# Patient Record
Sex: Female | Born: 1955
Health system: Southern US, Community
[De-identification: ages and names within clinical notes are randomized; demographics above are authoritative.]

## PROBLEM LIST (undated history)

## (undated) DIAGNOSIS — D219 Benign neoplasm of connective and other soft tissue, unspecified: Secondary | ICD-10-CM

## (undated) DIAGNOSIS — L309 Dermatitis, unspecified: Secondary | ICD-10-CM

## (undated) DIAGNOSIS — E785 Hyperlipidemia, unspecified: Secondary | ICD-10-CM

## (undated) DIAGNOSIS — E119 Type 2 diabetes mellitus without complications: Secondary | ICD-10-CM

## (undated) DIAGNOSIS — R32 Unspecified urinary incontinence: Secondary | ICD-10-CM

## (undated) DIAGNOSIS — T7840XA Allergy, unspecified, initial encounter: Secondary | ICD-10-CM

## (undated) DIAGNOSIS — I10 Essential (primary) hypertension: Secondary | ICD-10-CM

## (undated) DIAGNOSIS — F419 Anxiety disorder, unspecified: Secondary | ICD-10-CM

## (undated) DIAGNOSIS — N8500 Endometrial hyperplasia, unspecified: Secondary | ICD-10-CM

## (undated) DIAGNOSIS — E282 Polycystic ovarian syndrome: Secondary | ICD-10-CM

## (undated) DIAGNOSIS — K5790 Diverticulosis of intestine, part unspecified, without perforation or abscess without bleeding: Secondary | ICD-10-CM

## (undated) DIAGNOSIS — L719 Rosacea, unspecified: Secondary | ICD-10-CM

## (undated) DIAGNOSIS — N3010 Interstitial cystitis (chronic) without hematuria: Secondary | ICD-10-CM

## (undated) DIAGNOSIS — H269 Unspecified cataract: Secondary | ICD-10-CM

## (undated) HISTORY — DX: Rosacea, unspecified: L71.9

## (undated) HISTORY — PX: TONSILLECTOMY AND ADENOIDECTOMY: SUR1326

## (undated) HISTORY — DX: Type 2 diabetes mellitus without complications: E11.9

## (undated) HISTORY — PX: OTHER SURGICAL HISTORY: SHX169

## (undated) HISTORY — DX: Unspecified urinary incontinence: R32

## (undated) HISTORY — DX: Endometrial hyperplasia, unspecified: N85.00

## (undated) HISTORY — DX: Diverticulosis of intestine, part unspecified, without perforation or abscess without bleeding: K57.90

## (undated) HISTORY — DX: Essential (primary) hypertension: I10

## (undated) HISTORY — DX: Anxiety disorder, unspecified: F41.9

## (undated) HISTORY — DX: Hyperlipidemia, unspecified: E78.5

## (undated) HISTORY — DX: Unspecified cataract: H26.9

## (undated) HISTORY — PX: COLONOSCOPY: SHX174

## (undated) HISTORY — DX: Benign neoplasm of connective and other soft tissue, unspecified: D21.9

## (undated) HISTORY — PX: DILATION AND CURETTAGE OF UTERUS: SHX78

## (undated) HISTORY — DX: Interstitial cystitis (chronic) without hematuria: N30.10

## (undated) HISTORY — PX: EYE SURGERY: SHX253

## (undated) HISTORY — DX: Dermatitis, unspecified: L30.9

## (undated) HISTORY — PX: WISDOM TOOTH EXTRACTION: SHX21

## (undated) HISTORY — DX: Allergy, unspecified, initial encounter: T78.40XA

## (undated) HISTORY — DX: Polycystic ovarian syndrome: E28.2

---

## 1998-12-26 ENCOUNTER — Other Ambulatory Visit: Admission: RE | Admit: 1998-12-26 | Discharge: 1998-12-26 | Payer: Self-pay | Admitting: Obstetrics and Gynecology

## 1999-10-19 ENCOUNTER — Other Ambulatory Visit: Admission: RE | Admit: 1999-10-19 | Discharge: 1999-10-19 | Payer: Self-pay | Admitting: Obstetrics and Gynecology

## 2000-10-20 ENCOUNTER — Other Ambulatory Visit: Admission: RE | Admit: 2000-10-20 | Discharge: 2000-10-20 | Payer: Self-pay | Admitting: Obstetrics and Gynecology

## 2001-11-11 ENCOUNTER — Other Ambulatory Visit: Admission: RE | Admit: 2001-11-11 | Discharge: 2001-11-11 | Payer: Self-pay | Admitting: Obstetrics and Gynecology

## 2002-11-30 ENCOUNTER — Other Ambulatory Visit: Admission: RE | Admit: 2002-11-30 | Discharge: 2002-11-30 | Payer: Self-pay | Admitting: Obstetrics and Gynecology

## 2003-12-09 ENCOUNTER — Other Ambulatory Visit: Admission: RE | Admit: 2003-12-09 | Discharge: 2003-12-09 | Payer: Self-pay | Admitting: Obstetrics and Gynecology

## 2004-12-11 ENCOUNTER — Other Ambulatory Visit: Admission: RE | Admit: 2004-12-11 | Discharge: 2004-12-11 | Payer: Self-pay | Admitting: Obstetrics and Gynecology

## 2005-03-21 HISTORY — PX: ENDOMETRIAL BIOPSY: SHX622

## 2005-09-12 ENCOUNTER — Ambulatory Visit: Payer: Self-pay | Admitting: Internal Medicine

## 2005-09-26 ENCOUNTER — Ambulatory Visit: Payer: Self-pay | Admitting: Internal Medicine

## 2005-09-30 ENCOUNTER — Encounter: Admission: RE | Admit: 2005-09-30 | Discharge: 2005-12-29 | Payer: Self-pay | Admitting: Internal Medicine

## 2006-01-10 ENCOUNTER — Other Ambulatory Visit: Admission: RE | Admit: 2006-01-10 | Discharge: 2006-01-10 | Payer: Self-pay | Admitting: Obstetrics and Gynecology

## 2007-01-20 ENCOUNTER — Other Ambulatory Visit: Admission: RE | Admit: 2007-01-20 | Discharge: 2007-01-20 | Payer: Self-pay | Admitting: Obstetrics and Gynecology

## 2007-12-25 IMAGING — CT CT CHEST W/O CM
1 series · 1 of 1 positions shown, 2 images · IV contrast (CONTRAST)
Comparison: none

[Series 3: tracker · 1 of 1 slices shown, 2 images]
[im 1/1  mediastinal]
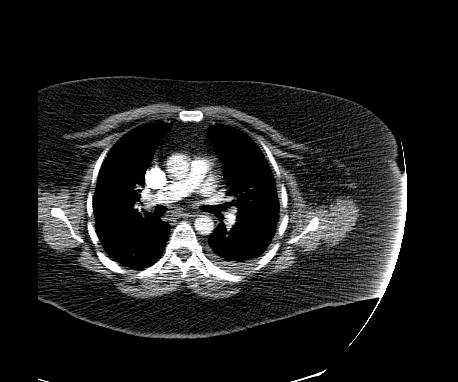
[im 1/1  lung]
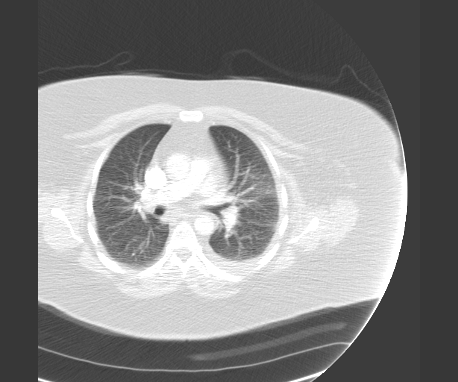

[1 of 1 positions shown; findings below may reference images not displayed]

Canned report from images found in remote index.

Refer to host system for actual result text.

## 2008-01-01 ENCOUNTER — Other Ambulatory Visit: Admission: RE | Admit: 2008-01-01 | Discharge: 2008-01-01 | Payer: Self-pay | Admitting: Obstetrics and Gynecology

## 2009-03-21 HISTORY — PX: ENDOMETRIAL BIOPSY: SHX622

## 2011-01-22 HISTORY — PX: EYE SURGERY: SHX253

## 2012-08-08 ENCOUNTER — Ambulatory Visit (INDEPENDENT_AMBULATORY_CARE_PROVIDER_SITE_OTHER): Payer: BC Managed Care – PPO | Admitting: Physician Assistant

## 2012-08-08 VITALS — BP 110/70 | HR 86 | Temp 98.0°F | Resp 16 | Ht 61.5 in | Wt 162.0 lb

## 2012-08-08 DIAGNOSIS — T161XXA Foreign body in right ear, initial encounter: Secondary | ICD-10-CM

## 2012-08-08 DIAGNOSIS — T169XXA Foreign body in ear, unspecified ear, initial encounter: Secondary | ICD-10-CM

## 2012-08-08 NOTE — Progress Notes (Signed)
I was directly involved with the patient's care and agree with the physical, diagnosis and treatment plan.  

## 2012-08-08 NOTE — Progress Notes (Signed)
  Subjective:    Patient ID: Christina Warren, female    DOB: 03-Aug-1955, 57 y.o.   MRN: [id number]  HPI Patient presents today with Q-tip head stuck in right ear. Patient showered and was cleaning ears with Q-tip and the tip came off in her ear. She says she made the mistake of buying cheap brand of Q-tips. Pt was to scared to try to get the Q-tip out with tweezers and lives alone so she was not able to ask someone else try to get it out. She states she can hear it in her ear and feels it but it is not causing her any pain or hearing loss.   Review of Systems  HENT: Negative for hearing loss, ear pain and tinnitus.   All other systems reviewed and are negative.       Objective:   Physical Exam  Nursing note and vitals reviewed. Constitutional: She is oriented to person, place, and time. She appears well-developed and well-nourished. No distress.  HENT:  Head: Normocephalic and atraumatic.  Right Ear: Hearing and external ear normal. No lacerations. No drainage or tenderness. A foreign body (Q-tip end stuck in  right ear canal ) is present. Tympanic membrane is not perforated and not erythematous. No decreased hearing is noted.  Left Ear: Hearing, tympanic membrane, external ear and ear canal normal.  Nose: Nose normal.  Eyes: Conjunctivae are normal.  Neck: Normal range of motion. Neck supple.  Cardiovascular: Normal rate, regular rhythm and normal heart sounds.   Pulmonary/Chest: Effort normal and breath sounds normal.  Musculoskeletal: Normal range of motion.  Neurological: She is alert and oriented to person, place, and time.  Skin: Skin is warm and dry.  Psychiatric: She has a normal mood and affect. Her behavior is normal.    Q-tip end was removed with Alligator forceps. Ear canal appeared normal after removal of FB. No presence of perforation or damage to TM. No hearing loss.      Assessment & Plan:   Patient had foreign body (Q-tip) end stuck in right ear canal.  Discussed with patient other options besides Q-tips for cleaning her ears and also for application of steroid drops for eczema.

## 2013-02-17 ENCOUNTER — Encounter: Payer: Self-pay | Admitting: Obstetrics and Gynecology

## 2013-02-24 ENCOUNTER — Ambulatory Visit: Payer: Self-pay | Admitting: Obstetrics and Gynecology

## 2013-02-24 ENCOUNTER — Encounter: Payer: Self-pay | Admitting: Obstetrics and Gynecology

## 2013-02-24 ENCOUNTER — Ambulatory Visit (INDEPENDENT_AMBULATORY_CARE_PROVIDER_SITE_OTHER): Payer: BC Managed Care – PPO | Admitting: Obstetrics and Gynecology

## 2013-02-24 VITALS — BP 126/85 | HR 86 | Resp 16 | Ht 62.0 in | Wt 148.0 lb

## 2013-02-24 DIAGNOSIS — Z01419 Encounter for gynecological examination (general) (routine) without abnormal findings: Secondary | ICD-10-CM

## 2013-02-24 NOTE — Progress Notes (Signed)
GYNECOLOGY VISIT  PCP:  Christina Peaches, MD  Referring provider:   HPI: 58 y.o.   Single  Caucasian  female   G0P0 with No LMP recorded. Patient is postmenopausal.   here for   Annual Exam Had Mirena IUD inserted 03/2009. History of adenomatous hyperplasia with atypia (1998) currently treated with [place] IUD. Last EMB 04/07/09 polypoid endometrium with decidualization of stroma consistent with hormone effect.  No hyperplasia or malignancy noted.  Has had multiple EMBs over the years single initial diagnosis of atypical hyperplasia. Last EMB diagnosing hyperplasia was in 2004.     Patient has lost 50 pounds over two years. Done intentionally.   Has been on vitamin D prescription. Last level has been 72 in  October 2014.  Hgb:  PCP 10/2012     13.9 Urine:  PCP  GYNECOLOGIC HISTORY: No LMP recorded. Patient is postmenopausal. Sexually active:  no Partner preference: female Contraception:  Mirena IUD  Menopausal hormone therapy: no DES exposure:  no  Blood transfusions:  no  Sexually transmitted diseases:  no  GYN Procedures: Endometrial Bx 3/11, 4/08, 3/07, 4/05  Mammogram:   11/2012 normal - [facility], Dr. Nyoka Cowden ordering.  Pap:   02/14/12 neg, HR HPV negative History of abnormal pap smear:  no   OB History   Grav Para Term Preterm Abortions TAB SAB Ect Mult Living   0                LIFESTYLE: Exercise:no               Tobacco: no Alcohol: no Drug use:  no  OTHER HEALTH MAINTENANCE: Tetanus/TDap: 2008 Gardisil: no Influenza:  yes Zostavax: no  Bone density: 12/13/11 normal Colonoscopy: 9/07 normal, repeat in 10 years  Cholesterol check:  10/29/12 Normal  Family History  Problem Relation Age of Onset  . Rheum arthritis Mother   . Heart disease Father   . Heart disease Brother     There are no active problems to display for this patient.  Past Medical History  Diagnosis Date  . Allergy   . Cataract   . Diabetes mellitus without complication   . Anxiety   .  Hypertension   . Polycystic ovary   . Urinary incontinence   . Eczema   . Interstitial cystitis   . Diverticulosis   . Rosacea     Past Surgical History  Procedure Laterality Date  . Eye surgery    . Endo hyperplasia w/o atypia, polyp  2003  2004  . Endometrial biopsy  3/07    benign exop hormone effect  . Breast surgery    . Endometrial biopsy  3/11    benign    ALLERGIES: Review of patient's allergies indicates no known allergies.  Current Outpatient Prescriptions  Medication Sig Dispense Refill  . aspirin 81 MG tablet Take 81 mg by mouth daily.      . Calcium Carbonate-Vitamin D (CALCIUM + D PO) Take 600 Units by mouth 2 (two) times daily.      . cetirizine (ZYRTEC) 10 MG tablet Take 10 mg by mouth daily.      . clonazePAM (KLONOPIN) 0.5 MG tablet Take 0.5 mg by mouth 2 (two) times daily as needed for anxiety.      Marland Kitchen doxycycline (DORYX) 100 MG DR capsule Take 100 mg by mouth daily.       Marland Kitchen lisinopril (PRINIVIL,ZESTRIL) 20 MG tablet Take 20 mg by mouth daily.      Marland Kitchen  loteprednol (ALREX) 0.2 % SUSP 1 drop 4 (four) times daily.      . metFORMIN (GLUCOPHAGE) 1000 MG tablet Take 1,000 mg by mouth 2 (two) times daily with a meal.      . simvastatin (ZOCOR) 20 MG tablet Take 20 mg by mouth every evening.      . Vitamin D, Ergocalciferol, (DRISDOL) 50000 UNITS CAPS Take 50,000 Units by mouth every 14 (fourteen) days.        No current facility-administered medications for this visit.     ROS:  Pertinent items are noted in HPI.  SOCIAL HISTORY:  Single.    PHYSICAL EXAMINATION:    BP 126/85  Pulse 86  Resp 16  Ht 5\' 2"  (1.575 m)  Wt 148 lb (67.132 kg)  BMI 27.06 kg/m2   Wt Readings from Last 3 Encounters:  02/24/13 148 lb (67.132 kg)  08/08/12 162 lb (73.483 kg)     Ht Readings from Last 3 Encounters:  02/24/13 5\' 2"  (1.575 m)  08/08/12 5' 1.5" (1.562 m)    General appearance: alert, cooperative and appears stated age Head: Normocephalic, without obvious  abnormality, atraumatic Neck: no adenopathy, supple, symmetrical, trachea midline and thyroid not enlarged, symmetric, no tenderness/mass/nodules Lungs: clear to auscultation bilaterally Breasts: Inspection negative, No nipple retraction or dimpling, No nipple discharge or bleeding, No axillary or supraclavicular adenopathy, Normal to palpation without dominant masses Heart: regular rate and rhythm Abdomen: soft, non-tender; no masses,  no organomegaly Extremities: extremities normal, atraumatic, no cyanosis or edema Skin: Skin color, texture, turgor normal. No rashes or lesions Lymph nodes: Cervical, supraclavicular, and axillary nodes normal. No abnormal inguinal nodes palpated Neurologic: Grossly normal  Pelvic: External genitalia:  no lesions              Urethra:  normal appearing urethra with no masses, tenderness or lesions              Bartholins and Skenes: normal                 Vagina: normal appearing vagina with normal color and discharge, no lesions              Cervix: normal appearance              Pap and high risk HPV testing done: no.            Bimanual Exam:  Uterus:  uterus is normal size, shape, consistency and nontender                                      Adnexa: normal adnexa in size, nontender and no masses                                      Rectovaginal: Confirms                                      Anus:  normal sphincter tone, no lesions  ASSESSMENT  Normal gynecologic exam. Remote history of endometrial hyperplasia with atypia. Mirena IUD treatment.  Mirena due to be removed next year. Patient with high Vit D level.    PLAN  Mammogram yearly. Pap smear and high risk HPV testing not indicated.  Counseled  on endometrial hyperplasia. Will plan to remove Mirena next year and then do an EMB 3 - 6 months later. Stop Vit D prescription.  Can use just OTC Ca with Vit D.  Return annually or prn   An After Visit Summary was printed and given to the  patient.

## 2013-02-24 NOTE — Patient Instructions (Signed)

## 2013-10-04 ENCOUNTER — Encounter: Payer: Self-pay | Admitting: Obstetrics and Gynecology

## 2013-10-05 ENCOUNTER — Encounter: Payer: Self-pay | Admitting: Obstetrics and Gynecology

## 2014-02-28 ENCOUNTER — Ambulatory Visit: Payer: BC Managed Care – PPO | Admitting: Obstetrics and Gynecology

## 2014-03-02 ENCOUNTER — Encounter: Payer: Self-pay | Admitting: Obstetrics and Gynecology

## 2014-03-02 ENCOUNTER — Ambulatory Visit (INDEPENDENT_AMBULATORY_CARE_PROVIDER_SITE_OTHER): Payer: BLUE CROSS/BLUE SHIELD | Admitting: Obstetrics and Gynecology

## 2014-03-02 VITALS — BP 122/64 | HR 66 | Resp 14 | Ht 61.5 in | Wt 151.8 lb

## 2014-03-02 DIAGNOSIS — Z30432 Encounter for removal of intrauterine contraceptive device: Secondary | ICD-10-CM

## 2014-03-02 DIAGNOSIS — Z01419 Encounter for gynecological examination (general) (routine) without abnormal findings: Secondary | ICD-10-CM

## 2014-03-02 DIAGNOSIS — Z8639 Personal history of other endocrine, nutritional and metabolic disease: Secondary | ICD-10-CM

## 2014-03-02 NOTE — Progress Notes (Signed)
Patient ID: Christina Warren, female   DOB: Dec 02, 1955, 59 y.o.   MRN: [id number] 59 y.o. G0P0 SingleCaucasianF here for annual exam.    Will see Dr. Nyoka Cowden today as well.  Off all antiHTN, DM, and cholesterol medication.  [id number] Nyoka Cowden, MD  Wants vit D level check this year.  Last year it was elevated in the 70s and patient stopped prescription vit D.  Had Mirena IUD inserted 03/2009. History of adenomatous hyperplasia with atypia (1998) currently treated with [place] IUD. Last EMB 04/07/09 polypoid endometrium with decidualization of stroma consistent with hormone effect.  No hyperplasia or malignancy noted.   Has had multiple EMBs over the years single initial diagnosis of atypical hyperplasia. Last EMB diagnosing hyperplasia was in 2004.       Patient has lost 50 pounds over last 3 years. Done intentionally.   No LMP recorded. Patient is postmenopausal.          Sexually active: No. female partner The current method of family planning is post menopausal status.   Exercising: No.  none. Smoker:  no  Health Maintenance: Pap:  02-14-12 wnl:neg HR HPV History of abnormal Pap:  no MMG:  12-25-13 predominately fatty/nl:[facility] Colonoscopy:  09/2005 [facility] colonoscopy 2017 BMD:   12-13-11 normal:[facility] TDaP:  2008 Screening Labs:   Hb today: PCP, Urine today: PCP   reports that she has never smoked. She has never used smokeless tobacco. She reports that she does not drink alcohol or use illicit drugs.  Past Medical History  Diagnosis Date  . Allergy   . Cataract   . Diabetes mellitus without complication   . Anxiety   . Hypertension   . Polycystic ovary   . Urinary incontinence   . Eczema   . Interstitial cystitis   . Diverticulosis   . Rosacea     Past Surgical History  Procedure Laterality Date  . Eye surgery    . Endo hyperplasia w/o atypia, polyp  2003  2004  . Endometrial biopsy  3/07    benign exop hormone effect  . Breast surgery    . Endometrial biopsy  3/11     benign    Current Outpatient Prescriptions  Medication Sig Dispense Refill  . aspirin 81 MG tablet Take 81 mg by mouth daily.    . Calcium Carbonate-Vitamin D (CALCIUM + D PO) Take 600 Units by mouth 2 (two) times daily.    . cetirizine (ZYRTEC) 10 MG tablet Take 10 mg by mouth daily.    . clonazePAM (KLONOPIN) 0.5 MG tablet Take 0.5 mg by mouth 2 (two) times daily as needed for anxiety.    Marland Kitchen doxycycline (DORYX) 100 MG DR capsule Take 100 mg by mouth daily.     Marland Kitchen loteprednol (LOTEMAX) 0.2 % SUSP 1 drop 4 (four) times daily.    . Multiple Vitamin (MULTIVITAMIN) capsule Take 1 capsule by mouth daily.    Marland Kitchen triamcinolone cream (KENALOG) 0.1 %   1  . fluocinonide (LIDEX) 0.05 % external solution   2   No current facility-administered medications for this visit.    Family History  Problem Relation Age of Onset  . Rheum arthritis Mother   . Heart disease Father   . Heart disease Brother     ROS:  Pertinent items are noted in HPI.  Otherwise, a comprehensive ROS was negative.  Exam:   BP 122/64 mmHg  Pulse 66  Resp 14  Ht 5' 1.5" (1.562 m)  Wt 151 lb 12.8 oz (  68.856 kg)  BMI 28.22 kg/m2     Height: 5' 1.5" (156.2 cm)  Ht Readings from Last 3 Encounters:  03/02/14 5' 1.5" (1.562 m)  02/24/13 5\' 2"  (1.575 m)  08/08/12 5' 1.5" (1.562 m)    General appearance: alert, cooperative and appears stated age Head: Normocephalic, without obvious abnormality, atraumatic Neck: no adenopathy, supple, symmetrical, trachea midline and thyroid normal to inspection and palpation Lungs: clear to auscultation bilaterally Breasts: normal appearance, no masses or tenderness, Inspection negative, No nipple retraction or dimpling, No nipple discharge or bleeding, No axillary or supraclavicular adenopathy Heart: regular rate and rhythm Abdomen: soft, non-tender; bowel sounds normal; no masses,  no organomegaly Extremities: extremities normal, atraumatic, no cyanosis or edema Skin: Skin color, texture,  turgor normal. No rashes or lesions Lymph nodes: Cervical, supraclavicular, and axillary nodes normal. No abnormal inguinal nodes palpated Neurologic: Grossly normal   Pelvic: External genitalia:  no lesions              Urethra:  normal appearing urethra with no masses, tenderness or lesions              Bartholins and Skenes: normal                 Vagina: normal appearing vagina with normal color and discharge, no lesions              Cervix: no lesions              Pap taken: No. Bimanual Exam:  Uterus:  normal size, contour, position, consistency, mobility, non-tender              Adnexa: normal adnexa and no mass, fullness, tenderness               Rectovaginal: Confirms               Anus:  normal sphincter tone, no lesions  Chaperone was present for exam.  A:  Well Woman with normal exam Mirena IUD patient.  History of endometrial hyperplasia.  Status post 50 pound weight loss in last 3 years.  History of vit D deficiency.   P:   Mammogram yearly.  pap smear not indicated this year.  Check vit D level.  Will precert IUD removal.  return annually or prn

## 2014-03-02 NOTE — Patient Instructions (Signed)

## 2014-03-03 LAB — VITAMIN D 25 HYDROXY (VIT D DEFICIENCY, FRACTURES): Vit D, 25-Hydroxy: 41 ng/mL (ref 30–100)

## 2014-03-31 ENCOUNTER — Telehealth: Payer: Self-pay | Admitting: Emergency Medicine

## 2014-03-31 NOTE — Telephone Encounter (Signed)
Patient calling with request to have IUD removed. She is aware IUD expires in March and requests appointment for 05/13/14 only.

## 2014-03-31 NOTE — Telephone Encounter (Signed)
Patient scheduled for removal of IUD on 05/13/14.  It has been pre-certified with insurance  and patient has been contacted with that information. Patient wishes to have removal only. Aware not covered for contraception past 5 years, patient not sexually active.   IUD removal appointment scheduled.  Routing to provider for final review. Patient agreeable to disposition. Will close encounter

## 2014-04-27 ENCOUNTER — Telehealth: Payer: Self-pay | Admitting: Obstetrics and Gynecology

## 2014-04-27 DIAGNOSIS — Z30432 Encounter for removal of intrauterine contraceptive device: Secondary | ICD-10-CM

## 2014-04-27 NOTE — Telephone Encounter (Signed)
Confirmed patient benefit for IUD removal. Patient agreeable.

## 2014-04-27 NOTE — Addendum Note (Signed)
Addended by: Michele Mcalpine on: 04/27/2014 03:02 PM   Modules accepted: Orders

## 2014-04-27 NOTE — Telephone Encounter (Signed)
-----   Message from Salome Arnt sent at 04/27/2014 10:48 AM EDT ----- Contact: [phone] Patient would like to speak with you regarding her procedure and the filing of insurance.

## 2014-04-27 NOTE — Telephone Encounter (Signed)
Left message for patient to call back  

## 2014-05-13 ENCOUNTER — Encounter: Payer: Self-pay | Admitting: Obstetrics and Gynecology

## 2014-05-13 ENCOUNTER — Ambulatory Visit (INDEPENDENT_AMBULATORY_CARE_PROVIDER_SITE_OTHER): Payer: BLUE CROSS/BLUE SHIELD | Admitting: Obstetrics and Gynecology

## 2014-05-13 VITALS — BP 130/90 | HR 80 | Ht 61.5 in | Wt 154.2 lb

## 2014-05-13 DIAGNOSIS — N907 Vulvar cyst: Secondary | ICD-10-CM | POA: Diagnosis not present

## 2014-05-13 DIAGNOSIS — Z30432 Encounter for removal of intrauterine contraceptive device: Secondary | ICD-10-CM | POA: Diagnosis not present

## 2014-05-13 NOTE — Progress Notes (Signed)
GYNECOLOGY  VISIT   HPI: 59 y.o.   Single  Caucasian  female   G0P0 with No LMP recorded. Patient is postmenopausal.   here for  IUD Removal Had IUD for treatment of endometrial hyperplasia.  Had had a Mirena IUD for 10 years.  Lost 50 pounds over last 3 years.  Last prior biopsy documenting hyperplasia was in 2004.  Notes skin tags on bottom. Denies symptoms.   GYNECOLOGIC HISTORY: No LMP recorded. Patient is postmenopausal. Contraception:  Mirena  Menopausal hormone therapy: None Last Pap: 02/14/12 NEG HR HPV negative  Last Mammogram: 12/25/13 Bi-Rads 1: Negative         OB History    Gravida Para Term Preterm AB TAB SAB Ectopic Multiple Living   0                  There are no active problems to display for this patient.   Past Medical History  Diagnosis Date  . Allergy   . Cataract   . Diabetes mellitus without complication   . Anxiety   . Hypertension   . Polycystic ovary   . Urinary incontinence   . Eczema   . Interstitial cystitis   . Diverticulosis   . Rosacea     Past Surgical History  Procedure Laterality Date  . Eye surgery    . Endo hyperplasia w/o atypia, polyp  2003  2004  . Endometrial biopsy  3/07    benign exop hormone effect  . Breast surgery    . Endometrial biopsy  3/11    benign    Current Outpatient Prescriptions  Medication Sig Dispense Refill  . aspirin 81 MG tablet Take 81 mg by mouth daily.    . Calcium Carbonate-Vitamin D (CALCIUM + D PO) Take 600 Units by mouth 2 (two) times daily.    . cetirizine (ZYRTEC) 10 MG tablet Take 10 mg by mouth daily.    . clonazePAM (KLONOPIN) 0.5 MG tablet Take 0.5 mg by mouth 2 (two) times daily as needed for anxiety.    Marland Kitchen doxycycline (DORYX) 100 MG DR capsule Take 100 mg by mouth daily.     . fluocinonide (LIDEX) 0.05 % external solution   2  . loteprednol (LOTEMAX) 0.2 % SUSP 1 drop 4 (four) times daily.    . Multiple Vitamin (MULTIVITAMIN) capsule Take 1 capsule by mouth daily.    Marland Kitchen  triamcinolone cream (KENALOG) 0.1 %   1   No current facility-administered medications for this visit.     ALLERGIES: Review of patient's allergies indicates no known allergies.  Family History  Problem Relation Age of Onset  . Rheum arthritis Mother   . Heart disease Father   . Heart disease Brother     History   Social History  . Marital Status: Single    Spouse Name: N/A  . Number of Children: N/A  . Years of Education: N/A   Occupational History  . Not on file.   Social History Main Topics  . Smoking status: Never Smoker   . Smokeless tobacco: Never Used  . Alcohol Use: No  . Drug Use: No  . Sexual Activity: No     Comment: Mirena IUD--inserted 03/2009   Other Topics Concern  . Not on file   Social History Narrative    ROS:  Pertinent items are noted in HPI.  PHYSICAL EXAMINATION:    BP 130/90 mmHg  Pulse 80  Ht 5' 1.5" (1.562 m)  Wt 154  lb 3.2 oz (69.945 kg)  BMI 28.67 kg/m2     General - No acute distress.  Pelvic: External genitalia:   7 mm raised, soft pink skin tag of the left labia majora inferiorly.  Surrounded by 2 - 3 mm firm sebaceous skin cysts.               Urethra:  normal appearing urethra with no masses, tenderness or lesions              Bartholins and Skenes: normal                 Vagina: normal appearing vagina with normal color and discharge, no lesions              Cervix: normal appearance.  IUD strings noted.                   Bimanual Exam:  Uterus:  uterus is normal size, shape, consistency and nontender                                      Adnexa: normal adnexa in size, nontender and no masses                                      IUD removal -  Consent for procedure.  Ring forceps used to remove IUD without difficulty.  IUD intact and discarded.   ASSESSMENT  IUD removal.  History of hyperplasia.  Significant intentional weight loss.  Risk of recurrence of hyperplasia likely is low.  Vulva skin tag and cysts.  Appear  to be benign.   PLAN  Call for any bleeding.   Discussion of vulvar skin tag and cysts.  Biopsy of the vulvar areas discussed and are optional.   Return for annual exam and as needed.  An After Visit Summary was printed and given to the patient.  __10____ minutes face to face time of which over 50% was spent in counseling.

## 2014-05-30 ENCOUNTER — Telehealth: Payer: Self-pay | Admitting: Obstetrics and Gynecology

## 2014-05-30 DIAGNOSIS — N950 Postmenopausal bleeding: Secondary | ICD-10-CM

## 2014-05-30 NOTE — Telephone Encounter (Signed)
Spoke with patient. She states that since Mirena IUD has been removed she has had episodes of vaginal bleeding, spotting only. She has not required the use of any more than a panty liner.  She states she did not have any bleeding after removal until 4/26 and spotted until 4/29. No bleeding on 4/30 through 4/31. States last week had spotting x 1 day and unsure which day and then one episode of spotting, last night. Patient denies pain or fevers.   Patient very hesitant to schedule an office visit or any potential procedures due to financial limitations.   Advised patient will request Dr. Quincy Simmonds review message and return call. Patient agreeable. She will call back if develops heavy bleeding, fevers, or abdominal pain. Patient verbalized understanding of instructions.

## 2014-05-30 NOTE — Telephone Encounter (Signed)
Pt would like nurse to call regarding the removal of her iud.

## 2014-05-30 NOTE — Telephone Encounter (Signed)
Needs appointment for pelvic ultrasound and potential EMB with me.  I think we can start with the office ultrasound and then go from there to see if the biopsy is needed the same day.  I do recommend evaluation.

## 2014-05-31 NOTE — Telephone Encounter (Signed)
Spoke with patient and message from Dr. Quincy Simmonds given. Discussed reasons for possible biopsy if Pelvic ultrasound gives indication of need to do so, to evaluate for any presence of abnormal cells or precancerous changes of the uterine lining, infection, polyps or fibroids. Patient verbalized understanding of this. She states she has not met her insurance deductible and would like to discuss financial aspects of procedure. Advised would request that insurance coordinator call patient with insurance pre certification. Patient agreeable.   Orders placed.     cc Felipa Emory

## 2014-05-31 NOTE — Telephone Encounter (Signed)
Call to patient. Advised of benefit quote received for PUS/possible EMB. Also advised that there may be associated pathology charges, but because they are billed by the lab. I am unable to quote those costs. Patient agreeable.  Patient states that she will call back to schedule.

## 2014-06-01 NOTE — Telephone Encounter (Signed)
Please keep patient in work que for evaluation of postmenopausal bleeding.  Just had Mirena IUD removed which has been long term treatment for endometrial hyperplasia.  Cc- Christina Warren

## 2014-06-03 NOTE — Telephone Encounter (Signed)
Pt calling to schedule ultrasound. Would like an appointment for 6/24 if possible.

## 2014-06-06 NOTE — Telephone Encounter (Signed)
Returned call to patient. She has a new job and requests to schedule in June due to time off needed. Patient is scheduled for Pelvic ultrasound  With possible Endometrial biopsy for 07/14/14 (patient choice of date).  Patient advised to take 800 mg Motrin with food one hour before appointment, she states she has had many biopsies and she knows what to plan for.  Patient states she has not had any vaginal bleeding since 06/02/14 but she is keeping a bleeding calendar and will bring with her to appointment.   Routing to provider for final review. Patient agreeable to disposition  Will close encounter.

## 2014-06-17 ENCOUNTER — Telehealth: Payer: Self-pay | Admitting: Obstetrics and Gynecology

## 2014-06-17 NOTE — Telephone Encounter (Signed)
Patient called and cancelled her appointment for a PUS/possible EMB for 07/14/14 with Dr. Quincy Simmonds. She said, "I don't think I need this appointment so I will not be rescheduling."

## 2014-06-21 NOTE — Telephone Encounter (Signed)
Dr. Quincy Simmonds can you review and advise? Okay to cancel orders for Pelvic ultrasound and Endometrial biopsy?

## 2014-06-22 ENCOUNTER — Telehealth: Payer: Self-pay | Admitting: Obstetrics and Gynecology

## 2014-06-22 NOTE — Telephone Encounter (Signed)
Patient states she is returning a call from [place].

## 2014-06-22 NOTE — Telephone Encounter (Signed)
Call to patient. Discussed message from Dr. Quincy Simmonds.  Patient states she has not had any bleeding at all since 05/29/14. She has been keeping calendar. Patient states she understands risk of not proceeding with ultrasound and endometrial biopsy at this time and again states she understands risk of  Cancelling procedures to evaluate for any presence of, infection, polyps or fibroids, and abnormal cells or precancerous changes of the uterine lining including up to an including cancer.   Patient states she will continue to monitor and will return call if any further bleeding.

## 2014-06-22 NOTE — Telephone Encounter (Signed)
Please advise that I am not sure the etiology of the bleeding.  I cannot tell her if it is withdrawal bleeding from IUD removal or if there is pathology inside the uterine cavity - polyp, fibroid, endometrial hyperplasia, cancer, etc.  If she has any further bleeding, I strongly recommend proceeding with evaluation.

## 2014-06-22 NOTE — Telephone Encounter (Signed)
Message left to return call to Talayia Hjort at [phone].    

## 2014-06-22 NOTE — Telephone Encounter (Signed)
OK to cancel sonohysterogram and EMB at this time.  Patient informed.

## 2014-06-23 NOTE — Telephone Encounter (Signed)
Orders cancelled per Dr. Quincy Simmonds.  Encounter closed.

## 2014-07-14 ENCOUNTER — Other Ambulatory Visit: Payer: BLUE CROSS/BLUE SHIELD | Admitting: Obstetrics and Gynecology

## 2014-07-14 ENCOUNTER — Other Ambulatory Visit: Payer: BLUE CROSS/BLUE SHIELD

## 2015-03-08 ENCOUNTER — Ambulatory Visit: Payer: BLUE CROSS/BLUE SHIELD | Admitting: Obstetrics and Gynecology

## 2015-04-12 ENCOUNTER — Ambulatory Visit (INDEPENDENT_AMBULATORY_CARE_PROVIDER_SITE_OTHER): Payer: BLUE CROSS/BLUE SHIELD | Admitting: Obstetrics and Gynecology

## 2015-04-12 ENCOUNTER — Encounter: Payer: Self-pay | Admitting: Obstetrics and Gynecology

## 2015-04-12 VITALS — BP 140/78 | HR 76 | Resp 16 | Ht 61.5 in | Wt 143.6 lb

## 2015-04-12 DIAGNOSIS — Z01419 Encounter for gynecological examination (general) (routine) without abnormal findings: Secondary | ICD-10-CM

## 2015-04-12 DIAGNOSIS — N8500 Endometrial hyperplasia, unspecified: Secondary | ICD-10-CM

## 2015-04-12 DIAGNOSIS — E559 Vitamin D deficiency, unspecified: Secondary | ICD-10-CM

## 2015-04-12 NOTE — Patient Instructions (Signed)

## 2015-04-12 NOTE — Progress Notes (Signed)
Patient ID: Christina Warren, female   DOB: May 25, 1955, 60 y.o.   MRN: [id number] 60 y.o. G0P0 Single Caucasian female here for annual exam.    No vaginal bleeding or spotting.   Saw dermatology for a skin check.  Preliminary evaluation showed dysplasia.  She will have further evaluation in April.  Difficulty sleeping.  Tried medication through PCP.  This did not work, could not sleep at all.   Heaviest weight was 220 pounds. Now weighs 143.6. Hx of endometrial hyperplasia and wonders if she needs a repeat biopsy.  Had Mirena IUDs to treat this until 2016 when it was removed. Last EMB dx with endometrial hyperplasia was 2004.  PCP: Levin Erp, MD    Patient's last menstrual period was 03/21/2009.          Sexually active: No.  The current method of family planning is post menopausal status.    Exercising: No.   Smoker:  no  Health Maintenance: Pap:  02-14-12 Neg:Neg HR HPV History of abnormal Pap:  no MMG:  02-04-15 Density Cat.A/Neg/BiRads1:[facility] Colonoscopy:  09/2005 normal;next due 09/2015 BMD:   12-13-11  Result  Normal:[facility]--Dr. Nyoka Cowden orders TDaP:  PCP Screening Labs:  Hb today: PCP, Urine today: PCP   reports that she has never smoked. She has never used smokeless tobacco. She reports that she does not drink alcohol or use illicit drugs.  Past Medical History  Diagnosis Date  . Allergy   . Cataract   . Diabetes mellitus without complication ([place])   . Anxiety   . Hypertension   . Polycystic ovary   . Urinary incontinence   . Eczema   . Interstitial cystitis   . Diverticulosis   . Rosacea     Past Surgical History  Procedure Laterality Date  . Eye surgery    . Endo hyperplasia w/o atypia, polyp  2003  2004  . Endometrial biopsy  3/07    benign exop hormone effect  . Breast surgery    . Endometrial biopsy  3/11    benign    Current Outpatient Prescriptions  Medication Sig Dispense Refill  . aspirin 81 MG tablet Take 81 mg by mouth daily.    .  Calcium Carbonate-Vitamin D (CALCIUM + D PO) Take 600 Units by mouth 2 (two) times daily.    . cetirizine (ZYRTEC) 10 MG tablet Take 10 mg by mouth daily.    . clobetasol (OLUX) 0.05 % topical foam Apply 1 application topically as needed.  2  . clonazePAM (KLONOPIN) 0.5 MG tablet Take 0.5 mg by mouth 2 (two) times daily as needed for anxiety.    Marland Kitchen doxycycline (DORYX) 100 MG DR capsule Take 100 mg by mouth daily.     Marland Kitchen ELIDEL 1 % cream Apply 1 application topically as needed.  2  . fluocinonide (LIDEX) 0.05 % external solution   2  . Multiple Vitamin (MULTIVITAMIN) capsule Take 1 capsule by mouth daily.    . Multiple Vitamins-Minerals (EYE SUPPORT PO) Take by mouth daily.     No current facility-administered medications for this visit.    Family History  Problem Relation Age of Onset  . Rheum arthritis Mother   . Heart disease Father   . Heart disease Brother     ROS:  Pertinent items are noted in HPI.  Otherwise, a comprehensive ROS was negative.  Exam:   BP 140/78 mmHg  Pulse 76  Resp 16  Ht 5' 1.5" (1.562 m)  Wt 143 lb 9.6 oz (  65.137 kg)  BMI 26.70 kg/m2  LMP 03/21/2009    General appearance: alert, cooperative and appears stated age Head: Normocephalic, without obvious abnormality, atraumatic Neck: no adenopathy, supple, symmetrical, trachea midline and thyroid normal to inspection and palpation Lungs: clear to auscultation bilaterally Breasts: normal appearance, no masses or tenderness, Inspection negative, No nipple retraction or dimpling, No nipple discharge or bleeding, No axillary or supraclavicular adenopathy Heart: regular rate and rhythm Abdomen: soft, non-tender;  no masses,  no organomegaly Extremities: extremities normal, atraumatic, no cyanosis or edema Skin: Skin color, texture, turgor normal. No rashes or lesions Lymph nodes: Cervical, supraclavicular, and axillary nodes normal. No abnormal inguinal nodes palpated Neurologic: Grossly normal  Pelvic:  External genitalia:  no lesions              Urethra:  normal appearing urethra with no masses, tenderness or lesions              Bartholins and Skenes: normal                 Vagina: normal appearing vagina with normal color and discharge, no lesions              Cervix: no lesions              Pap taken: Yes.   Bimanual Exam:  Uterus:  normal size, contour, position, consistency, mobility, non-tender              Adnexa: normal adnexa and no mass, fullness, tenderness              Rectovaginal: Yes.  .  Confirms.              Anus:  normal sphincter tone, no lesions  Chaperone was present for exam.  Assessment:   Well woman visit with normal exam. Mirena IUD patient until 2016.  History of endometrial hyperplasia.  Significant intentional weight loss. History of vit D deficiency Insomnia.   Plan: Yearly mammogram recommended after age 46.  Recommended self breast exam.  Pap and HR HPV as above. Discussed Calcium, Vitamin D, regular exercise program including cardiovascular and weight bearing exercise. Labs performed.  Yes.  .   See orders.  Vit D check. Refills given on medications.  No..  I discussed insomnia and recommended melatonin and regular vigorous exercise. Follow up annually and prn.   Additional counseling given regarding endometrial hyperplasia.  Will order pelvic ultrasound to evaluate endometrium.   After visit summary provided.

## 2015-04-13 LAB — VITAMIN D 25 HYDROXY (VIT D DEFICIENCY, FRACTURES): Vit D, 25-Hydroxy: 42 ng/mL (ref 30–100)

## 2015-04-17 ENCOUNTER — Telehealth: Payer: Self-pay | Admitting: Obstetrics and Gynecology

## 2015-04-17 LAB — IPS PAP TEST WITH HPV

## 2015-04-17 NOTE — Telephone Encounter (Signed)
Spoke with pt regarding benefit for ultrsound. Patient understood and agreeable. Patient ready to schedule. Patient scheduled 06/08/15 with Dr Quincy Simmonds. Pt aware of arrival date and time. Pt aware of 72 hours cancellation policy with [id number] fee. No further questions. Ok to close

## 2015-06-05 ENCOUNTER — Telehealth: Payer: Self-pay | Admitting: Obstetrics and Gynecology

## 2015-06-05 NOTE — Telephone Encounter (Signed)
patient canceled her ultrasound appointment for 06/08/15 due to a work conflict. She will call back to schedule at a later time to reschedule.

## 2015-06-07 NOTE — Telephone Encounter (Signed)
Ok to move the ultrasound forward in time.  You may keep the ultrasound order in the work queue.  You may close this encounter.  Thanks.

## 2015-06-07 NOTE — Telephone Encounter (Signed)
Called patient to follow up and reschedule ultrasound appointment. Patient stated she will not be able to move forward in rescheduling until end of June or first of July, due to her work schedule. Patient also states she will call back at a later date to reschedule. Routing to Dr Quincy Simmonds for reveiw

## 2015-06-08 ENCOUNTER — Other Ambulatory Visit: Payer: BLUE CROSS/BLUE SHIELD

## 2015-06-08 ENCOUNTER — Other Ambulatory Visit: Payer: BLUE CROSS/BLUE SHIELD | Admitting: Obstetrics and Gynecology

## 2015-07-05 ENCOUNTER — Telehealth: Payer: Self-pay | Admitting: Obstetrics and Gynecology

## 2015-07-05 NOTE — Telephone Encounter (Signed)
error 

## 2015-08-14 ENCOUNTER — Encounter: Payer: Self-pay | Admitting: Gastroenterology

## 2016-03-22 ENCOUNTER — Telehealth: Payer: Self-pay | Admitting: Internal Medicine

## 2016-03-24 NOTE — Telephone Encounter (Signed)
Sure. Thanks 

## 2016-03-25 NOTE — Telephone Encounter (Signed)
LM on Vmail to CB to schedule an appt with Dr. Henrene Pastor

## 2016-04-18 ENCOUNTER — Encounter: Payer: Self-pay | Admitting: Obstetrics and Gynecology

## 2016-04-23 ENCOUNTER — Encounter: Payer: Self-pay | Admitting: Internal Medicine

## 2016-05-02 ENCOUNTER — Ambulatory Visit: Payer: BLUE CROSS/BLUE SHIELD | Admitting: Obstetrics and Gynecology

## 2016-05-24 NOTE — Progress Notes (Signed)
61 y.o. G0P0 Single Caucasian female here for annual exam.    Hx of endometrial hyperplasia and wonders if she needs a repeat biopsy.  Had Mirena IUDs to treat this until 2016 when it was removed. Last EMB dx with endometrial hyperplasia was 2004.  Really wants to proceed with the pelvic ultrasound and EMB. Worried about cancer.  Was not able to do it previously due to family issues.  Denies vaginal bleeding.   States she has facial hair and used treatment for this in the past.  Using hair removal device but not a razor.  This is much improved but a chronic issue due to PCOS history.  Stated she urinary frequency.  Used spironolactone in the past.   Wants general blood work today.  Was not in to see her PCP last fall.   Taking care of her ill sister in law.   Did have hep B vaccine done and needed to repeat due to inadequate titer.  PCP:  Levin Erp, MD   Patient's last menstrual period was 03/21/2009.           Sexually active: No. female The current method of family planning is post menopausal status.    Exercising: No.   Smoker:  no  Health Maintenance: Pap: 04-12-15 Neg:Neg HR HPV; 02-14-12 Neg:Neg HR HPV History of abnormal Pap:  no MMG: 03-26-16 3D Density A/Neg/BiRads1:[facility] Colonoscopy:  09/2005 normal;next due 09/2015. Scheduled 07-11-16 with Dr. Henrene Pastor. BMD: 12-14-11  Result: normal with [facility]--Dr.Green orders TDaP: PCP Gardasil:   no HIV: Unsure Hep C: Unsure Screening Labs:  Urine today: not done   reports that she has never smoked. She has never used smokeless tobacco. She reports that she drinks alcohol. She reports that she does not use drugs.  Past Medical History:  Diagnosis Date  . Allergy   . Anxiety   . Cataract   . Diabetes mellitus without complication ([place])   . Diverticulosis   . Eczema   . Hypertension   . Interstitial cystitis   . Polycystic ovary   . Rosacea   . Urinary incontinence     Past Surgical History:  Procedure Laterality Date   . BREAST SURGERY    . Endo hyperplasia w/o atypia, polyp  2003  2004  . ENDOMETRIAL BIOPSY  3/07   benign exop hormone effect  . ENDOMETRIAL BIOPSY  3/11   benign  . EYE SURGERY      Current Outpatient Prescriptions  Medication Sig Dispense Refill  . Biotin w/ Vitamins C & E (HAIR/SKIN/NAILS PO) Take 1 capsule by mouth daily.    . Calcium-Magnesium-Vitamin D (CALCIUM MAGNESIUM PO) Take 1 tablet by mouth daily.    . clobetasol (OLUX) 0.05 % topical foam Apply 1 application topically as needed.  2  . clonazePAM (KLONOPIN) 0.5 MG tablet Take 0.5 mg by mouth 2 (two) times daily as needed for anxiety.    . Coenzyme Q10 ([employer] COENZYME Q10) 100 MG capsule Take 200 mg by mouth daily.     . diphenhydrAMINE (BENADRYL) 25 MG tablet Take 25 mg by mouth 2 (two) times daily.    Marland Kitchen ELIDEL 1 % cream Apply 1 application topically as needed.  2  . fluocinonide (LIDEX) 0.05 % external solution   2  . ibuprofen (ADVIL,MOTRIN) 200 MG tablet Take 200 mg by mouth every 6 (six) hours as needed.    . Incontinence Supply Disposable (AIR-DRI BREATHABLES PLUS) MISC 1 tablet by Does not apply route 2 (two) times daily.    Marland Kitchen  Multiple Vitamin (MULTIVITAMIN) capsule Take 1 capsule by mouth daily.    . Multiple Vitamin (MULTIVITAMIN) capsule Take 1 capsule by mouth daily.    . Multiple Vitamins-Minerals (EYE SUPPORT PO) Take by mouth daily.    Marland Kitchen omega-3 acid ethyl esters (LOVAZA) 1 g capsule Take by mouth daily.     No current facility-administered medications for this visit.     Family History  Problem Relation Age of Onset  . Rheum arthritis Mother   . Heart disease Father   . Heart disease Brother     ROS:  Pertinent items are noted in HPI.  Otherwise, a comprehensive ROS was negative.  Exam:   BP (!) 142/84 (BP Location: Right Arm, Patient Position: Sitting, Cuff Size: Normal)   Pulse 80   Resp 16   Ht 5' 1.5" (1.562 m)   Wt 138 lb 9.6 oz (62.9 kg)   LMP 03/21/2009   BMI 25.76 kg/m     General  appearance: alert, cooperative and appears stated age Head: Normocephalic, without obvious abnormality, atraumatic Neck: no adenopathy, supple, symmetrical, trachea midline and thyroid normal to inspection and palpation Lungs: clear to auscultation bilaterally Breasts: normal appearance, no masses or tenderness, No nipple retraction or dimpling, No nipple discharge or bleeding, No axillary or supraclavicular adenopathy Heart: regular rate and rhythm Abdomen: soft, non-tender; no masses, no organomegaly Extremities: extremities normal, atraumatic, no cyanosis or edema Skin: Skin color, texture, turgor normal. No rashes or lesions Lymph nodes: Cervical, supraclavicular, and axillary nodes normal. No abnormal inguinal nodes palpated Neurologic: Grossly normal  Pelvic: External genitalia:  no lesions              Urethra:  normal appearing urethra with no masses, tenderness or lesions              Bartholins and Skenes: normal                 Vagina: normal appearing vagina with normal color and discharge, no lesions              Cervix: no lesions              Pap taken: No. Bimanual Exam:  Uterus:  normal size, contour, position, consistency, mobility, non-tender              Adnexa: no mass, fullness, tenderness              Rectal exam: Yes.  .  Confirms.              Anus:  normal sphincter tone, no lesions  Chaperone was present for exam.  Assessment:   Well woman visit with normal exam. Mirena IUD patient until 2016.  History of endometrial hyperplasia.  Abnormal facial hair.  Significant intentional weight loss. History of vit D deficiency Insomnia.   Plan: Mammogram screening discussed. Recommended self breast awareness. Pap and HR HPV as above. Guidelines for Calcium, Vitamin D, regular exercise program including cardiovascular and weight bearing exercise. Routine labs, testosterone, hep C, and HIV. Return for pelvic ultrasound and endometrial biopsy.  We discussed  that her weight loss has likely been very effective treatment for endometrial hyperplasia. Consider spironolactone.  Follow up annually and prn.   After visit summary provided.

## 2016-05-27 ENCOUNTER — Encounter: Payer: Self-pay | Admitting: Obstetrics and Gynecology

## 2016-05-27 ENCOUNTER — Ambulatory Visit (INDEPENDENT_AMBULATORY_CARE_PROVIDER_SITE_OTHER): Payer: BLUE CROSS/BLUE SHIELD | Admitting: Obstetrics and Gynecology

## 2016-05-27 VITALS — BP 142/84 | HR 80 | Resp 16 | Ht 61.5 in | Wt 138.6 lb

## 2016-05-27 DIAGNOSIS — N8500 Endometrial hyperplasia, unspecified: Secondary | ICD-10-CM

## 2016-05-27 DIAGNOSIS — Z113 Encounter for screening for infections with a predominantly sexual mode of transmission: Secondary | ICD-10-CM | POA: Diagnosis not present

## 2016-05-27 DIAGNOSIS — L678 Other hair color and hair shaft abnormalities: Secondary | ICD-10-CM

## 2016-05-27 DIAGNOSIS — Z01419 Encounter for gynecological examination (general) (routine) without abnormal findings: Secondary | ICD-10-CM | POA: Diagnosis not present

## 2016-05-27 LAB — LIPID PANEL
Cholesterol: 202 mg/dL — ABNORMAL HIGH (ref <200)
HDL: 54 mg/dL (ref >50)
LDL Cholesterol: 122 mg/dL — ABNORMAL HIGH (ref <100)
Total CHOL/HDL Ratio: 3.7 Ratio (ref <5.0)
Triglycerides: 129 mg/dL (ref <150)
VLDL: 26 mg/dL (ref <30)

## 2016-05-27 LAB — COMPREHENSIVE METABOLIC PANEL
ALT: 12 U/L (ref 6–29)
AST: 15 U/L (ref 10–35)
Albumin: 4.3 g/dL (ref 3.6–5.1)
Alkaline Phosphatase: 67 U/L (ref 33–130)
BUN: 7 mg/dL (ref 7–25)
CO2: 28 mmol/L (ref 20–31)
Calcium: 9.3 mg/dL (ref 8.6–10.4)
Chloride: 105 mmol/L (ref 98–110)
Creat: 0.74 mg/dL (ref 0.50–0.99)
Glucose, Bld: 99 mg/dL (ref 65–99)
Potassium: 3.7 mmol/L (ref 3.5–5.3)
Sodium: 143 mmol/L (ref 135–146)
Total Bilirubin: 0.5 mg/dL (ref 0.2–1.2)
Total Protein: 6.9 g/dL (ref 6.1–8.1)

## 2016-05-27 LAB — TSH: TSH: 1.42 mIU/L

## 2016-05-27 LAB — CBC
HCT: 44.4 % (ref 35.0–45.0)
Hemoglobin: 15.3 g/dL (ref 11.7–15.5)
MCH: 32.4 pg (ref 27.0–33.0)
MCHC: 34.5 g/dL (ref 32.0–36.0)
MCV: 94.1 fL (ref 80.0–100.0)
MPV: 9.2 fL (ref 7.5–12.5)
Platelets: 270 10*3/uL (ref 140–400)
RBC: 4.72 MIL/uL (ref 3.80–5.10)
RDW: 13.8 % (ref 11.0–15.0)
WBC: 5.6 10*3/uL (ref 3.8–10.8)

## 2016-05-27 LAB — HIV ANTIBODY (ROUTINE TESTING W REFLEX): HIV 1&2 Ab, 4th Generation: NONREACTIVE

## 2016-05-27 NOTE — Patient Instructions (Signed)

## 2016-05-28 ENCOUNTER — Telehealth: Payer: Self-pay | Admitting: Obstetrics and Gynecology

## 2016-05-28 LAB — HEPATITIS C ANTIBODY: HCV Ab: NEGATIVE

## 2016-05-28 LAB — HEMOGLOBIN A1C
Hgb A1c MFr Bld: 5 % (ref <5.7)
Mean Plasma Glucose: 97 mg/dL

## 2016-05-28 LAB — VITAMIN D 25 HYDROXY (VIT D DEFICIENCY, FRACTURES): Vit D, 25-Hydroxy: 52 ng/mL (ref 30–100)

## 2016-05-28 NOTE — Telephone Encounter (Signed)
Thank you for the update.  I have closed the encounter.  

## 2016-05-28 NOTE — Telephone Encounter (Signed)
Patient returned call. Reviewed benefit for an ultrasound and endometrial biopsy. Patient understood and agreeable. Patient scheduled 06/20/16 with Dr Quincy Simmonds. Earlier appointment dates of 06/06/16 and 06/13/16 were offered, but patient declined due to training and work conflicts. Patient aware of date, arrival time and cancellation policy. No further questions.   Routing to Dr Quincy Simmonds for final review

## 2016-05-28 NOTE — Telephone Encounter (Signed)
Called patient to review benefits for a recommended ultrasound and endometrial biopsy. Left Voicemail requesting a call back.

## 2016-05-29 LAB — TESTOS,TOTAL,FREE AND SHBG (FEMALE)
Sex Hormone Binding Glob.: 70 nmol/L (ref 14–73)
Testosterone, Free: 1.2 pg/mL (ref 0.1–6.4)
Testosterone,Total,LC/MS/MS: 16 ng/dL (ref 2–45)

## 2016-05-30 ENCOUNTER — Other Ambulatory Visit: Payer: Self-pay | Admitting: *Deleted

## 2016-05-30 MED ORDER — SPIRONOLACTONE 50 MG PO TABS: 50.0000 mg | ORAL_TABLET | Freq: Two times a day (BID) | ORAL | 0 refills | Status: DC

## 2016-06-20 ENCOUNTER — Ambulatory Visit (INDEPENDENT_AMBULATORY_CARE_PROVIDER_SITE_OTHER): Payer: BLUE CROSS/BLUE SHIELD | Admitting: Obstetrics and Gynecology

## 2016-06-20 ENCOUNTER — Encounter: Payer: Self-pay | Admitting: Obstetrics and Gynecology

## 2016-06-20 ENCOUNTER — Ambulatory Visit (INDEPENDENT_AMBULATORY_CARE_PROVIDER_SITE_OTHER): Payer: BLUE CROSS/BLUE SHIELD

## 2016-06-20 VITALS — BP 130/72 | HR 76 | Ht 61.5 in | Wt 138.0 lb

## 2016-06-20 DIAGNOSIS — D259 Leiomyoma of uterus, unspecified: Secondary | ICD-10-CM

## 2016-06-20 DIAGNOSIS — N8500 Endometrial hyperplasia, unspecified: Secondary | ICD-10-CM

## 2016-06-20 NOTE — Patient Instructions (Signed)
Uterine Fibroids Uterine fibroids are tissue masses (tumors) that can develop in the womb (uterus). They are also called leiomyomas. This type of tumor is not cancerous (benign) and does not spread to other parts of the body outside of the pelvic area, which is between the hip bones. Occasionally, fibroids may develop in the fallopian tubes, in the cervix, or on the support structures (ligaments) that surround the uterus. You can have one or many fibroids. Fibroids can vary in size, weight, and where they grow in the uterus. Some can become quite large. Most fibroids do not require medical treatment. What are the causes? A fibroid can develop when a single uterine cell keeps growing (replicating). Most cells in the human body have a control mechanism that keeps them from replicating without control. What are the signs or symptoms? Symptoms may include:  Heavy bleeding during your period.  Bleeding or spotting between periods.  Pelvic pain and pressure.  Bladder problems, such as needing to urinate more often (urinary frequency) or urgently.  Inability to reproduce offspring (infertility).  Miscarriages.  How is this diagnosed? Uterine fibroids are diagnosed through a physical exam. Your health care provider may feel the lumpy tumors during a pelvic exam. Ultrasonography and an MRI may be done to determine the size, location, and number of fibroids. How is this treated? Treatment may include:  Watchful waiting. This involves getting the fibroid checked by your health care provider to see if it grows or shrinks. Follow your health care provider's recommendations for how often to have this checked.  Hormone medicines. These can be taken by mouth or given through an intrauterine device (IUD).  Surgery. ? Removing the fibroids (myomectomy) or the uterus (hysterectomy). ? Removing blood supply to the fibroids (uterine artery embolization).  If fibroids interfere with your fertility and you  want to become pregnant, your health care provider may recommend having the fibroids removed. Follow these instructions at home:  Keep all follow-up visits as directed by your health care provider. This is important.  Take over-the-counter and prescription medicines only as told by your health care provider. ? If you were prescribed a hormone treatment, take the hormone medicines exactly as directed.  Ask your health care provider about taking iron pills and increasing the amount of dark green, leafy vegetables in your diet. These actions can help to boost your blood iron levels, which may be affected by heavy menstrual bleeding.  Pay close attention to your period and tell your health care provider about any changes, such as: ? Increased blood flow that requires you to use more pads or tampons than usual per month. ? A change in the number of days that your period lasts per month. ? A change in symptoms that are associated with your period, such as abdominal cramping or back pain. Contact a health care provider if:  You have pelvic pain, back pain, or abdominal cramps that cannot be controlled with medicines.  You have an increase in bleeding between and during periods.  You soak tampons or pads in a half hour or less.  You feel lightheaded, extra tired, or weak. Get help right away if:  You faint.  You have a sudden increase in pelvic pain. This information is not intended to replace advice given to you by your health care provider. Make sure you discuss any questions you have with your health care provider. Document Released: 01/05/2000 Document Revised: 09/07/2015 Document Reviewed: 07/06/2013 Elsevier Interactive Patient Education  2018 [employer] Inc.  

## 2016-06-20 NOTE — Progress Notes (Signed)
Patient ID: Christina Warren, female   DOB: Nov 04, 1955, 61 y.o.   MRN: [id number] GYNECOLOGY  VISIT   HPI: 61 y.o.   Single  Caucasian  female   G0P0 with Patient's last menstrual period was 03/21/2009.   here for pelvic ultrasound for history of endometrial hyperplasia.    Has had significant weight loss since her Dx of endometrial hyperplasia.  Used a Mirena IUD in the past.   Denies any vaginal bleeding.   Now on Spironolactone for increased facial hair.  Used this in the past as well for this same indication. Denies dizziness. Has appt for K level check in August.  Normal testosterone 05/27/16.  LDL cholesterol 122 on 05/27/16.  PCP- Dr. Levin Erp.  Has appointment for later this year.  GYNECOLOGIC HISTORY: Patient's last menstrual period was 03/21/2009. Contraception:  Postmenopausal Menopausal hormone therapy:  none Last mammogram: 03-26-16 3D Density A/Neg/BiRads1:[facility] Last pap smear:  04-12-15 Neg:Neg HR HPV; 02-14-12 Neg:Neg HR HPV        OB History    Gravida Para Term Preterm AB Living   0             SAB TAB Ectopic Multiple Live Births                     There are no active problems to display for this patient.   Past Medical History:  Diagnosis Date  . Allergy   . Anxiety   . Cataract   . Diabetes mellitus without complication ([place])   . Diverticulosis   . Eczema   . Endometrial hyperplasia    Hx of this in past.  Was treated with weight loss and Mirena IUD.  EMS 1.3 mm on 06/20/16.  . Fibroid    By ultrasound 06/20/16.  Marland Kitchen Hypertension   . Interstitial cystitis   . Polycystic ovary   . Rosacea   . Urinary incontinence     Past Surgical History:  Procedure Laterality Date  . BREAST SURGERY    . Endo hyperplasia w/o atypia, polyp  2003  2004  . ENDOMETRIAL BIOPSY  3/07   benign exop hormone effect  . ENDOMETRIAL BIOPSY  3/11   benign  . EYE SURGERY      Current Outpatient Prescriptions  Medication Sig Dispense Refill  . Biotin w/ Vitamins  C & E (HAIR/SKIN/NAILS PO) Take 1 capsule by mouth daily.    . Calcium-Magnesium-Vitamin D (CALCIUM MAGNESIUM PO) Take 1 tablet by mouth daily.    . clobetasol (OLUX) 0.05 % topical foam Apply 1 application topically as needed.  2  . clonazePAM (KLONOPIN) 0.5 MG tablet Take 0.5 mg by mouth 2 (two) times daily as needed for anxiety.    . Coenzyme Q10 ([employer]S COENZYME Q10) 100 MG capsule Take 200 mg by mouth daily.     . diphenhydrAMINE (BENADRYL) 25 MG tablet Take 25 mg by mouth 2 (two) times daily.    Marland Kitchen ELIDEL 1 % cream Apply 1 application topically as needed.  2  . fluocinonide (LIDEX) 0.05 % external solution   2  . fluorometholone (FML) 0.1 % ophthalmic suspension Place 2 drops into both eyes daily.    Marland Kitchen ibuprofen (ADVIL,MOTRIN) 200 MG tablet Take 200 mg by mouth every 6 (six) hours as needed.    . Multiple Vitamin (MULTIVITAMIN) capsule Take 1 capsule by mouth daily.    . Multiple Vitamin (MULTIVITAMIN) capsule Take 1 capsule by mouth daily.    . Multiple Vitamins-Minerals (  EYE SUPPORT PO) Take by mouth daily.    Marland Kitchen omega-3 acid ethyl esters (LOVAZA) 1 g capsule Take by mouth daily.    Marland Kitchen OVER THE COUNTER MEDICATION Breath tablets--takes daily to help with bad breath    . spironolactone (ALDACTONE) 50 MG tablet Take 1 tablet (50 mg total) by mouth 2 (two) times daily. 180 tablet 0   No current facility-administered medications for this visit.      ALLERGIES: Patient has no known allergies.  Family History  Problem Relation Age of Onset  . Rheum arthritis Mother   . Heart disease Father   . Heart disease Brother     Social History   Social History  . Marital status: Single    Spouse name: N/A  . Number of children: N/A  . Years of education: N/A   Occupational History  . Not on file.   Social History Main Topics  . Smoking status: Never Smoker  . Smokeless tobacco: Never Used  . Alcohol use 0.0 oz/week     Comment: 1 drink every 3 months  . Drug use: No  . Sexual activity:  No   Other Topics Concern  . Not on file   Social History Narrative  . No narrative on file    ROS:  Pertinent items are noted in HPI.  PHYSICAL EXAMINATION:    BP 130/72 (BP Location: Right Arm, Patient Position: Sitting, Cuff Size: Normal)   Pulse 76   Ht 5' 1.5" (1.562 m)   Wt 138 lb (62.6 kg)   LMP 03/21/2009   BMI 25.65 kg/m     General appearance: alert, cooperative and appears stated age  Pelvic ultrasound 3 small fibroids, largest 1 cm. EMS 1.30 mm.  Normal ovaries.  No free fluid.  ASSESSMENT  Hx endometrial hyperplasia.  Status post weight loss.  Hx Mirena IUD use. No indication for biopsy today. Fibroid uterus. Mildly elevated LDL cholesterol.  PLAN  Discussed fibroids and endometrial hyperplasia.  No indication for biopsy today.  Call for any future bleeding.  Return for K level check in October. Discussed low cholesterol diet.  See PCP for routine care.   An After Visit Summary was printed and given to the patient.  _15_____ minutes face to face time of which over 50% was spent in counseling.

## 2016-06-27 ENCOUNTER — Encounter: Payer: Self-pay | Admitting: Internal Medicine

## 2016-06-27 ENCOUNTER — Ambulatory Visit (AMBULATORY_SURGERY_CENTER): Payer: Self-pay | Admitting: *Deleted

## 2016-06-27 VITALS — Ht 61.0 in | Wt 138.0 lb

## 2016-06-27 DIAGNOSIS — Z1211 Encounter for screening for malignant neoplasm of colon: Secondary | ICD-10-CM

## 2016-06-27 MED ORDER — SUPREP BOWEL PREP KIT 17.5-3.13-1.6 GM/177ML PO SOLN: 1.0000 | Freq: Once | ORAL | 0 refills | Status: AC

## 2016-06-27 NOTE — Progress Notes (Signed)
Patient denies any allergies to egg or soy products. Patient denies complications with anesthesia/sedation.  Patient denies oxygen use at home and denies diet medications. Denied pamphlet on colonoscopy.

## 2016-07-11 ENCOUNTER — Encounter: Payer: Self-pay | Admitting: Internal Medicine

## 2016-07-11 ENCOUNTER — Ambulatory Visit (AMBULATORY_SURGERY_CENTER): Payer: BLUE CROSS/BLUE SHIELD | Admitting: Internal Medicine

## 2016-07-11 VITALS — BP 132/68 | HR 71 | Temp 98.0°F | Resp 15 | Ht 61.0 in | Wt 138.0 lb

## 2016-07-11 DIAGNOSIS — Z1211 Encounter for screening for malignant neoplasm of colon: Secondary | ICD-10-CM | POA: Diagnosis not present

## 2016-07-11 DIAGNOSIS — Z1212 Encounter for screening for malignant neoplasm of rectum: Secondary | ICD-10-CM

## 2016-07-11 MED ORDER — SODIUM CHLORIDE 0.9 % IV SOLN: 500.0000 mL | INTRAVENOUS | Status: DC

## 2016-07-11 NOTE — Progress Notes (Signed)
When getting up pt. Stated that stomach was hurting some ambulated to bathroom to allow pt. To expel air. Abdomen soft and was passing air at bedside.[place] Dr. Henrene Pastor in to evaluate and instructed pt. To wait a little to see if she would pass some more gas,pt. Stated that she vomitted,but did  Not witness that,crackers and juice given pt. Only took small amount then refused any more. 1256 pt. Ambulated around unit with nurse and taken back to bathroom where she stated that she passed a little bit more.1310 Dr.Perry in to evaluate pt. And pt.stated she wants to go home and Dr.Perry told pt. That she had trapped air and that she would pass air,asked doctor if he wanted pt. To have levsin he stated she can go home and try and pass the air and call if she have any problems,she verbalize understanding,pt. Denies pain any where else.pt. Discharge per order.

## 2016-07-11 NOTE — Progress Notes (Signed)
Report to PACU, RN, vss, BBS= Clear.  

## 2016-07-11 NOTE — Patient Instructions (Signed)
YOU HAD AN ENDOSCOPIC PROCEDURE TODAY AT THE [facility]:   Refer to the procedure report that was given to you for any specific questions about what was found during the examination.  If the procedure report does not answer your questions, please call your gastroenterologist to clarify.  If you requested that your care partner not be given the details of your procedure findings, then the procedure report has been included in a sealed envelope for you to review at your convenience later.  YOU SHOULD EXPECT: Some feelings of bloating in the abdomen. Passage of more gas than usual.  Walking can help get rid of the air that was put into your GI tract during the procedure and reduce the bloating. If you had a lower endoscopy (such as a colonoscopy or flexible sigmoidoscopy) you may notice spotting of blood in your stool or on the toilet paper. If you underwent a bowel prep for your procedure, you may not have a normal bowel movement for a few days.  Please Note:  You might notice some irritation and congestion in your nose or some drainage.  This is from the oxygen used during your procedure.  There is no need for concern and it should clear up in a day or so.  SYMPTOMS TO REPORT IMMEDIATELY:   Following lower endoscopy (colonoscopy or flexible sigmoidoscopy):  Excessive amounts of blood in the stool  Significant tenderness or worsening of abdominal pains  Swelling of the abdomen that is new, acute  Fever of 100F or higher    For urgent or emergent issues, a gastroenterologist can be reached at any hour by calling [phone].   DIET:  We do recommend a small meal at first, but then you may proceed to your regular diet.  Drink plenty of fluids but you should avoid alcoholic beverages for 24 hours.  ACTIVITY:  You should plan to take it easy for the rest of today and you should NOT DRIVE or use heavy machinery until tomorrow (because of the sedation medicines used during the test).     FOLLOW UP: Our staff will call the number listed on your records the next business day following your procedure to check on you and address any questions or concerns that you may have regarding the information given to you following your procedure. If we do not reach you, we will leave a message.  However, if you are feeling well and you are not experiencing any problems, there is no need to return our call.  We will assume that you have returned to your regular daily activities without incident.  If any biopsies were taken you will be contacted by phone or by letter within the next 1-3 weeks.  Please call us at [phone] if you have not heard about the biopsies in 3 weeks.    SIGNATURES/CONFIDENTIALITY: You and/or your care partner have signed paperwork which will be entered into your electronic medical record.  These signatures attest to the fact that that the information above on your After Visit Summary has been reviewed and is understood.  Full responsibility of the confidentiality of this discharge information lies with you and/or your care-partner.   Resume medications. Information given on diverticulosis. 

## 2016-07-11 NOTE — Op Note (Signed)
[facility] Patient Name: Christina Warren Procedure Date: 07/11/2016 11:34 AM MRN: [id number] Endoscopist: Docia Chuck. Henrene Pastor , MD Age: 61 Referring MD:  Date of Birth: 05-14-1955 Gender: Female Account #: [id number] Procedure:                Colonoscopy Indications:              Screening for colorectal malignant neoplasm.                            Previous negative examination September 2007 (Dr.                            Olevia Perches) Medicines:                Monitored Anesthesia Care Procedure:                Pre-Anesthesia Assessment:                           - Prior to the procedure, a History and Physical                            was performed, and patient medications and                            allergies were reviewed. The patient's tolerance of                            previous anesthesia was also reviewed. The risks                            and benefits of the procedure and the sedation                            options and risks were discussed with the patient.                            All questions were answered, and informed consent                            was obtained. Prior Anticoagulants: The patient has                            taken no previous anticoagulant or antiplatelet                            agents. ASA Grade Assessment: II - A patient with                            mild systemic disease. After reviewing the risks                            and benefits, the patient was deemed in  satisfactory condition to undergo the procedure.                           After obtaining informed consent, the colonoscope                            was passed under direct vision. Throughout the                            procedure, the patient's blood pressure, pulse, and                            oxygen saturations were monitored continuously. The                            Colonoscope was introduced through the anus and                        advanced to the the cecum, identified by                            appendiceal orifice and ileocecal valve. The                            ileocecal valve, appendiceal orifice, and rectum                            were photographed. The quality of the bowel                            preparation was excellent. The colonoscopy was                            performed without difficulty. The patient tolerated                            the procedure well. The bowel preparation used was                            SUPREP. Scope In: 11:45:11 AM Scope Out: 11:56:24 AM Scope Withdrawal Time: 0 hours 8 minutes 37 seconds  Total Procedure Duration: 0 hours 11 minutes 13 seconds  Findings:                 Multiple medium-mouthed diverticula were found in                            the left colon.                           The exam was otherwise without abnormality on                            direct and retroflexion views. Complications:            No immediate complications. Estimated blood loss:  None. Estimated Blood Loss:     Estimated blood loss: none. Impression:               - Diverticulosis in the left colon.                           - The examination was otherwise normal on direct                            and retroflexion views.                           - No specimens collected. Recommendation:           - Repeat colonoscopy in 10 years for screening                            purposes.                           - Patient has a contact number available for                            emergencies. The signs and symptoms of potential                            delayed complications were discussed with the                            patient. Return to normal activities tomorrow.                            Written discharge instructions were provided to the                            patient.                           - Resume previous  diet.                           - Continue present medications. Docia Chuck. Henrene Pastor, MD 07/11/2016 12:10:49 PM This report has been signed electronically.

## 2016-07-11 NOTE — Progress Notes (Signed)
Pt's states no medical or surgical changes since previsit or office visit. 

## 2016-07-12 ENCOUNTER — Telehealth: Payer: Self-pay | Admitting: *Deleted

## 2016-07-12 NOTE — Telephone Encounter (Signed)
  Follow up Call-  Call back number 07/11/2016  Post procedure Call Back phone  # [phone]  Permission to leave phone message Yes  Some recent data might be hidden     Patient questions:  Do you have a fever, pain , or abdominal swelling? No. Pain Score  0 *  Have you tolerated food without any problems? Yes.    Have you been able to return to your normal activities? Yes.    Do you have any questions about your discharge instructions: Diet   No. Medications  No. Follow up visit  No.  Do you have questions or concerns about your Care? Yes.   Patient informed me that she was nauseated after the procedure with some vomiting, she was given zofran in recovery and discharged an hour later. She felt better in the afternoon with no further symptoms.  Actions: * If pain score is 4 or above: No action needed, pain <4.

## 2016-07-23 ENCOUNTER — Other Ambulatory Visit: Payer: Self-pay | Admitting: Obstetrics and Gynecology

## 2016-07-23 NOTE — Telephone Encounter (Signed)
Medication refill request: aldactone  Last AEX:  05/27/16 Dr. Quincy Simmonds  Next AEX: 06/05/17  Last MMG (if hormonal medication request): 03/26/16 BIRADS1:neg  Refill authorized: 05/30/16 [id number]. Today please advise.

## 2016-08-29 ENCOUNTER — Encounter: Payer: Self-pay | Admitting: Obstetrics and Gynecology

## 2016-08-29 ENCOUNTER — Ambulatory Visit (INDEPENDENT_AMBULATORY_CARE_PROVIDER_SITE_OTHER): Payer: BLUE CROSS/BLUE SHIELD | Admitting: Obstetrics and Gynecology

## 2016-08-29 VITALS — BP 136/76 | HR 80 | Resp 16 | Wt 140.0 lb

## 2016-08-29 DIAGNOSIS — L680 Hirsutism: Secondary | ICD-10-CM

## 2016-08-29 DIAGNOSIS — Z5181 Encounter for therapeutic drug level monitoring: Secondary | ICD-10-CM

## 2016-08-29 MED ORDER — SPIRONOLACTONE 50 MG PO TABS: 50.0000 mg | ORAL_TABLET | Freq: Two times a day (BID) | ORAL | 2 refills | Status: DC

## 2016-08-29 NOTE — Progress Notes (Signed)
GYNECOLOGY  VISIT   HPI: 61 y.o.   Single  Caucasian  female   G0P0 with Patient's last menstrual period was 03/21/2009.   here for 3 month follow up.  Spironolactone is working well.  Hair growth is well controlled.  GYNECOLOGIC HISTORY: Patient's last menstrual period was 03/21/2009. Contraception:  Postmenopausal Menopausal hormone therapy:  none Last mammogram: 03-26-16 3D Density A/Neg/BiRads1:[facility]  Last pap smear: 04-12-15 Neg:Neg HR HPV; 02-14-12 Neg:Neg HR HPV         OB History    Gravida Para Term Preterm AB Living   0             SAB TAB Ectopic Multiple Live Births                     There are no active problems to display for this patient.   Past Medical History:  Diagnosis Date  . Allergy   . Anxiety   . Cataract    Had surgery to remove  . Diabetes mellitus without complication ([place])    History - no problem since 2013 - lost weight approx 80 lbs  . Diverticulosis   . Eczema   . Endometrial hyperplasia    Hx of this in past.  Was treated with weight loss and Mirena IUD.  EMS 1.3 mm on 06/20/16.  . Fibroid    By ultrasound 06/20/16.  Marland Kitchen Hypertension    History - no problems since 2013 - lost weight - 80 lbs  . Interstitial cystitis    History - no problems since 2000  . Polycystic ovary   . Rosacea   . Urinary incontinence     Past Surgical History:  Procedure Laterality Date  . COLONOSCOPY    . DILATION AND CURETTAGE OF UTERUS    . Endo hyperplasia w/o atypia, polyp  2003  2004  . ENDOMETRIAL BIOPSY  3/07   benign exop hormone effect  . ENDOMETRIAL BIOPSY  3/11   benign  . EYE SURGERY Left 2013   cornea surgery  . EYE SURGERY Bilateral    cataracts  . TONSILLECTOMY AND ADENOIDECTOMY    . WISDOM TOOTH EXTRACTION      Current Outpatient Prescriptions  Medication Sig Dispense Refill  . Biotin w/ Vitamins C & E (HAIR/SKIN/NAILS PO) Take 1 capsule by mouth daily.    . Calcium-Magnesium-Vitamin D (CALCIUM MAGNESIUM PO) Take 1 tablet by mouth  daily.    . clobetasol (OLUX) 0.05 % topical foam Apply 1 application topically as needed.  2  . clonazePAM (KLONOPIN) 0.5 MG tablet Take 0.5 mg by mouth 2 (two) times daily as needed for anxiety.    . Coenzyme Q10 ([employer] COENZYME Q10) 100 MG capsule Take 200 mg by mouth daily.     . diphenhydrAMINE (BENADRYL) 25 MG tablet Take 25 mg by mouth 2 (two) times daily.    Marland Kitchen ELIDEL 1 % cream Apply 1 application topically as needed.  2  . fluocinonide (LIDEX) 0.05 % external solution   2  . fluorometholone (FML) 0.1 % ophthalmic suspension Place 2 drops into both eyes daily.    Marland Kitchen ibuprofen (ADVIL,MOTRIN) 200 MG tablet Take 200 mg by mouth every 6 (six) hours as needed.    . Multiple Vitamin (MULTIVITAMIN) capsule Take 1 capsule by mouth daily.    . Multiple Vitamins-Minerals (EYE SUPPORT PO) Take by mouth daily.    Marland Kitchen omega-3 acid ethyl esters (LOVAZA) 1 g capsule Take by mouth daily.    Marland Kitchen  OVER THE COUNTER MEDICATION Breath tablets--takes daily to help with bad breath    . spironolactone (ALDACTONE) 50 MG tablet TAKE 1 TABLET BY MOUTH TWO  TIMES DAILY 180 tablet 0   Current Facility-Administered Medications  Medication Dose Route Frequency Provider Last Rate Last Dose  . 0.9 %  sodium chloride infusion  500 mL Intravenous Continuous Irene Shipper, MD         ALLERGIES: Patient has no known allergies.  Family History  Problem Relation Age of Onset  . Rheum arthritis Mother   . Heart disease Father   . Heart disease Brother   . Colon cancer Neg Hx   . Colon polyps Neg Hx   . Rectal cancer Neg Hx   . Stomach cancer Neg Hx     Social History   Social History  . Marital status: Single    Spouse name: N/A  . Number of children: N/A  . Years of education: N/A   Occupational History  . Not on file.   Social History Main Topics  . Smoking status: Never Smoker  . Smokeless tobacco: Never Used  . Alcohol use 0.0 oz/week     Comment: 1 drink every 3 months  . Drug use: No  . Sexual  activity: No   Other Topics Concern  . Not on file   Social History Narrative  . No narrative on file    ROS:  Pertinent items are noted in HPI.  PHYSICAL EXAMINATION:    BP 136/76 (BP Location: Right Arm, Patient Position: Sitting, Cuff Size: Normal)   Pulse 80   Resp 16   Wt 140 lb (63.5 kg)   LMP 03/21/2009   BMI 26.45 kg/m     General appearance: alert, cooperative and appears stated age  ASSESSMENT  Increased hair growth.  Spironolactone monitoring.    PLAN  Check K level today.  Discussed the possibility of hyperkalemia.  Refill of spironolactone 50 mg po bid until annual exam is due.    An After Visit Summary was printed and given to the patient.

## 2016-08-30 LAB — POTASSIUM: Potassium: 4.2 mmol/L (ref 3.5–5.2)

## 2016-09-03 ENCOUNTER — Other Ambulatory Visit: Payer: Self-pay

## 2017-05-19 ENCOUNTER — Other Ambulatory Visit: Payer: Self-pay | Admitting: Obstetrics and Gynecology

## 2017-05-19 NOTE — Telephone Encounter (Signed)
Medication refill request: spironolactone  Last AEX:  05-27-16  Next AEX: 06-05-17  Last MMG (if hormonal medication request): 03-26-16 WNL  Refill authorized: please advise

## 2017-06-05 ENCOUNTER — Other Ambulatory Visit: Payer: Self-pay

## 2017-06-05 ENCOUNTER — Ambulatory Visit: Payer: BLUE CROSS/BLUE SHIELD | Admitting: Obstetrics and Gynecology

## 2017-06-05 ENCOUNTER — Encounter: Payer: Self-pay | Admitting: Obstetrics and Gynecology

## 2017-06-05 VITALS — BP 128/70 | HR 76 | Resp 16 | Ht 61.5 in | Wt 139.0 lb

## 2017-06-05 DIAGNOSIS — N812 Incomplete uterovaginal prolapse: Secondary | ICD-10-CM | POA: Diagnosis not present

## 2017-06-05 DIAGNOSIS — Z01419 Encounter for gynecological examination (general) (routine) without abnormal findings: Secondary | ICD-10-CM

## 2017-06-05 MED ORDER — SPIRONOLACTONE 50 MG PO TABS: ORAL_TABLET | ORAL | 1 refills | Status: DC

## 2017-06-05 NOTE — Patient Instructions (Signed)

## 2017-06-05 NOTE — Progress Notes (Signed)
62 y.o. G0P0 Single Caucasian female here for annual exam.    No vaginal bleeding.   Feels really tired.  Not exercising.  Difficulty sleeping at times.  Taking spironolactone once a day in the am for 6 - 7 months.  If she takes at night it keeps her awake due to urinary frequency.   Labs with PCP.  No TSH checked.  Normal CMP and CBC.  LDL cholesterol 143.  PCP: Dr. Jani Gravel     Patient's last menstrual period was 03/21/2009.           Sexually active: No.  The current method of family planning is post menopausal status.    Exercising: No.  The patient does not participate in regular exercise at present. Smoker:  no  Health Maintenance: Pap:  04-12-15 Neg:Neg HR HPV History of abnormal Pap:  no MMG:  04/02/17 - BI-RADS1, cat A density, [facility].  Colonoscopy:  07/11/16 normal f/u 10 years -- Dr. Henrene Pastor BMD:   12/14/11  Result  Normal with [facility] TDaP:  PCP Gardasil:   n/a HIV and Hep C: 05/27/16 Negative Screening Labs:  PCP   reports that she has never smoked. She has never used smokeless tobacco. She reports that she drinks alcohol. She reports that she does not use drugs.  Past Medical History:  Diagnosis Date  . Allergy   . Anxiety   . Cataract    Had surgery to remove  . Diabetes mellitus without complication ([place])    History - no problem since 2013 - lost weight approx 80 lbs  . Diverticulosis   . Eczema   . Endometrial hyperplasia    Hx of this in past.  Was treated with weight loss and Mirena IUD.  EMS 1.3 mm on 06/20/16.  . Fibroid    By ultrasound 06/20/16.  Marland Kitchen Hypertension    History - no problems since 2013 - lost weight - 80 lbs  . Interstitial cystitis    History - no problems since 2000  . Polycystic ovary   . Rosacea   . Urinary incontinence     Past Surgical History:  Procedure Laterality Date  . COLONOSCOPY    . DILATION AND CURETTAGE OF UTERUS    . Endo hyperplasia w/o atypia, polyp  2003  2004  . ENDOMETRIAL BIOPSY  3/07   benign exop  hormone effect  . ENDOMETRIAL BIOPSY  3/11   benign  . EYE SURGERY Left 2013   cornea surgery  . EYE SURGERY Bilateral    cataracts  . TONSILLECTOMY AND ADENOIDECTOMY    . WISDOM TOOTH EXTRACTION      Current Outpatient Medications  Medication Sig Dispense Refill  . Acetylcarnitine HCl (ACETYL L-CARNITINE) 250 MG CAPS Take by mouth.    . Biotin w/ Vitamins C & E (HAIR/SKIN/NAILS PO) Take 1 capsule by mouth daily.    . Calcium-Magnesium-Vitamin D (CALCIUM MAGNESIUM PO) Take 1 tablet by mouth daily.    . clonazePAM (KLONOPIN) 0.5 MG tablet Take 0.5 mg by mouth 2 (two) times daily as needed for anxiety.    . Coenzyme Q10 ([employer]S COENZYME Q10) 100 MG capsule Take 200 mg by mouth daily.     . diphenhydrAMINE (BENADRYL) 25 MG tablet Take 25 mg by mouth 2 (two) times daily.    Marland Kitchen ELIDEL 1 % cream Apply 1 application topically as needed.  2  . Eyelid Cleansers (AVENOVA) 0.01 % SOLN APPLY A SMALL AMOUNT TO SKIN TWICE A DAY AS DIRECTED  99  . fluocinonide (LIDEX) 0.05 % external solution   2  . fluorometholone (FML) 0.1 % ophthalmic suspension Place 2 drops into both eyes daily.    Marland Kitchen ibuprofen (ADVIL,MOTRIN) 200 MG tablet Take 200 mg by mouth every 6 (six) hours as needed.    . Multiple Vitamin (MULTIVITAMIN) capsule Take 1 capsule by mouth daily.    . Multiple Vitamins-Minerals (EYE SUPPORT PO) Take by mouth daily.    . NON FORMULARY Marine Collagen Peptide - take one tablet by mouth daily    . NON FORMULARY Circulation vein support - one tablet by mouth daily    . omega-3 acid ethyl esters (LOVAZA) 1 g capsule Take by mouth daily.    Marland Kitchen OVER THE COUNTER MEDICATION Breath tablets--takes daily to help with bad breath    . sodium chloride (MURO 128) 2 % ophthalmic solution Place 1 drop into both eyes.    Marland Kitchen spironolactone (ALDACTONE) 50 MG tablet TAKE 1 TABLET BY MOUTH TWO  TIMES DAILY 60 tablet 0   Current Facility-Administered Medications  Medication Dose Route Frequency Provider Last Rate Last  Dose  . 0.9 %  sodium chloride infusion  500 mL Intravenous Continuous Irene Shipper, MD        Family History  Problem Relation Age of Onset  . Rheum arthritis Mother   . Heart disease Father   . Heart disease Brother   . Colon cancer Neg Hx   . Colon polyps Neg Hx   . Rectal cancer Neg Hx   . Stomach cancer Neg Hx     Review of Systems  Constitutional: Negative.   HENT: Negative.   Eyes: Negative.   Respiratory: Negative.   Cardiovascular: Negative.   Gastrointestinal: Negative.   Endocrine: Negative.   Genitourinary: Negative.   Musculoskeletal: Negative.   Skin: Negative.   Allergic/Immunologic: Negative.   Neurological: Negative.   Hematological: Negative.   Psychiatric/Behavioral: Negative.     Exam:   BP 128/70 (BP Location: Right Arm, Patient Position: Sitting, Cuff Size: Normal)   Pulse 76   Resp 16   Ht 5' 1.5" (1.562 m)   Wt 139 lb (63 kg)   LMP 03/21/2009   BMI 25.84 kg/m     General appearance: alert, cooperative and appears stated age Head: Normocephalic, without obvious abnormality, atraumatic Neck: no adenopathy, supple, symmetrical, trachea midline and thyroid normal to inspection and palpation Lungs: clear to auscultation bilaterally Breasts: normal appearance, no masses or tenderness, No nipple retraction or dimpling, No nipple discharge or bleeding, No axillary or supraclavicular adenopathy Heart: regular rate and rhythm Abdomen: soft, non-tender; no masses, no organomegaly Extremities: extremities normal, atraumatic, no cyanosis or edema Skin: Skin color, texture, turgor normal. No rashes or lesions Lymph nodes: Cervical, supraclavicular, and axillary nodes normal. No abnormal inguinal nodes palpated Neurologic: Grossly normal  Pelvic: External genitalia:  no lesions              Urethra:  normal appearing urethra with no masses, tenderness or lesions              Bartholins and Skenes: normal                 Vagina: normal appearing  vagina with normal color and discharge, no lesions.  First to almost second degree uterine prolapse.              Cervix: no lesions              Pap  taken: No. Bimanual Exam:  Uterus:  normal size, contour, position, consistency, mobility, non-tender              Adnexa: no mass, fullness, tenderness              Rectal exam: Yes.  .  Confirms.              Anus:  normal sphincter tone, no lesions  Chaperone was present for exam.  Assessment:   Well woman visit with normal exam. Mirena IUD patient until 2016.  History of endometrial hyperplasia.  Fibroid uterus.  Abnormal facial hair.  Significant intentional weight loss. History of vit D deficiency. Insomnia.  Incomplete uterovaginal prolapse. Fatigue.   Plan: Mammogram screening. Recommended self breast awareness. Pap and HR HPV as above. Guidelines for Calcium, Vitamin D, regular exercise program including cardiovascular and weight bearing exercise. I recommend a regular exercise routine.  Check TSH.  Refill of Spironolactone 50 mg, take 2 in am.  We discussed pelvic organ prolapse - signs and symptoms and tx options of pessary, PT, and hysterectomy with vaginal support procedure.  ACOG HO given.  Recheck in 6 weeks including lab visit to check K level. Follow up annually and prn.     After visit summary provided.

## 2017-06-06 LAB — TSH: TSH: 1.81 u[IU]/mL (ref 0.450–4.500)

## 2017-07-14 ENCOUNTER — Encounter: Payer: Self-pay | Admitting: Obstetrics and Gynecology

## 2017-07-17 ENCOUNTER — Other Ambulatory Visit: Payer: Self-pay

## 2017-07-17 ENCOUNTER — Encounter: Payer: Self-pay | Admitting: Obstetrics and Gynecology

## 2017-07-17 ENCOUNTER — Ambulatory Visit: Payer: BLUE CROSS/BLUE SHIELD | Admitting: Obstetrics and Gynecology

## 2017-07-17 VITALS — BP 122/74 | HR 86 | Resp 16 | Ht 61.5 in | Wt 145.0 lb

## 2017-07-17 DIAGNOSIS — Z5181 Encounter for therapeutic drug level monitoring: Secondary | ICD-10-CM

## 2017-07-17 MED ORDER — SPIRONOLACTONE 50 MG PO TABS
ORAL_TABLET | ORAL | 2 refills | Status: DC
Start: 2017-07-17 — End: 2018-07-14

## 2017-07-17 NOTE — Patient Instructions (Signed)
Spironolactone tablets What is this medicine? SPIRONOLACTONE (speer on [place] LAK tone) is a diuretic. It helps you make more urine and to lose excess water from your body. This medicine is used to treat high blood pressure, and edema or swelling from heart, kidney, or liver disease. It is also used to treat patients who make too much aldosterone or have low potassium. This medicine may be used for other purposes; ask your health care provider or pharmacist if you have questions. COMMON BRAND NAME(S): Aldactone What should I tell my health care provider before I take this medicine? They need to know if you have any of these conditions: -high blood level of potassium -kidney disease or trouble making urine -liver disease -an unusual or allergic reaction to spironolactone, other medicines, foods, dyes, or preservatives -pregnant or trying to get pregnant -breast-feeding How should I use this medicine? Take this medicine by mouth with a drink of water. Follow the directions on your prescription label. You can take it with or without food. If it upsets your stomach, take it with food. Do not take your medicine more often than directed. Remember that you will need to pass more urine after taking this medicine. Do not take your doses at a time of day that will cause you problems. Do not take at bedtime. Talk to your pediatrician regarding the use of this medicine in children. While this drug may be prescribed for selected conditions, precautions do apply. Overdosage: If you think you have taken too much of this medicine contact a poison control center or emergency room at once. NOTE: This medicine is only for you. Do not share this medicine with others. What if I miss a dose? If you miss a dose, take it as soon as you can. If it is almost time for your next dose, take only that dose. Do not take double or extra doses. What may interact with this medicine? Do not take this medicine with any of the  following medications: -eplerenone This medicine may also interact with the following medications: -corticosteroids -digoxin -lithium -medicines for high blood pressure like ACE inhibitors -skeletal muscle relaxants like tubocurarine -NSAIDs, medicines for pain and inflammation, like ibuprofen or naproxen -potassium products like salt substitute or supplements -pressor amines like norepinephrine -some diuretics This list may not describe all possible interactions. Give your health care provider a list of all the medicines, herbs, non-prescription drugs, or dietary supplements you use. Also tell them if you smoke, drink alcohol, or use illegal drugs. Some items may interact with your medicine. What should I watch for while using this medicine? Visit your doctor or health care professional for regular checks on your progress. Check your blood pressure as directed. Ask your doctor what your blood pressure should be, and when you should contact them. You may need to be on a special diet while taking this medicine. Ask your doctor. Also, ask how many glasses of fluid you need to drink a day. You must not get dehydrated. This medicine may make you feel confused, dizzy or lightheaded. Drinking alcohol and taking some medicines can make this worse. Do not drive, use machinery, or do anything that needs mental alertness until you know how this medicine affects you. Do not sit or stand up quickly. What side effects may I notice from receiving this medicine? Side effects that you should report to your doctor or health care professional as soon as possible: -allergic reactions such as skin rash or itching, hives, swelling of the  lips, mouth, tongue, or throat -black or tarry stools -fast, irregular heartbeat -fever -muscle pain, cramps -numbness, tingling in hands or feet -trouble breathing -trouble passing urine -unusual bleeding -unusually weak or tired Side effects that usually do not require  medical attention (report to your doctor or health care professional if they continue or are bothersome): -change in voice or hair growth -confusion -dizzy, drowsy -dry mouth, increased thirst -enlarged or tender breasts -headache -irregular menstrual periods -sexual difficulty, unable to have an erection -stomach upset This list may not describe all possible side effects. Call your doctor for medical advice about side effects. You may report side effects to FDA at 1-800-FDA-1088. Where should I keep my medicine? Keep out of the reach of children. Store below 25 degrees C (77 degrees F). Throw away any unused medicine after the expiration date. NOTE: This sheet is a summary. It may not cover all possible information. If you have questions about this medicine, talk to your doctor, pharmacist, or health care provider.  2018 Elsevier/Gold Standard (2009-09-19 12:51:30)  

## 2017-07-17 NOTE — Progress Notes (Signed)
GYNECOLOGY  VISIT   HPI: 62 y.o.   Single  Caucasian  female   G0P0 with Christina Warren's last menstrual period was 03/21/2009.   here for   6 week recheck.    Using spironolactone for facial hair growth.  Has used this off and on for years.  Works ok but needs to remove facial hair from time to time.   Now taking spironolactone 50 mg, 2 tabs in the am. No dizziness.   GYNECOLOGIC HISTORY: Christina Warren's last menstrual period was 03/21/2009. Contraception:  Postmenopausal  Menopausal hormone therapy:  None  Last mammogram:  04-02-17 density A/BIRADS 1 negative  Last pap smear:   04-12-15 negative, HR HPV negative         OB History    Gravida  0   Para      Term      Preterm      AB      Living        SAB      TAB      Ectopic      Multiple      Live Births                 There are no active problems to display for this Christina Warren.   Past Medical History:  Diagnosis Date  . Allergy   . Anxiety   . Cataract    Had surgery to remove  . Diabetes mellitus without complication ([place])    History - no problem since 2013 - lost weight approx 80 lbs  . Diverticulosis   . Eczema   . Endometrial hyperplasia    Hx of this in past.  Was treated with weight loss and Mirena IUD.  EMS 1.3 mm on 06/20/16.  . Fibroid    By ultrasound 06/20/16.  Marland Kitchen Hypertension    History - no problems since 2013 - lost weight - 80 lbs  . Interstitial cystitis    History - no problems since 2000  . Polycystic ovary   . Rosacea   . Urinary incontinence     Past Surgical History:  Procedure Laterality Date  . COLONOSCOPY    . DILATION AND CURETTAGE OF UTERUS    . Endo hyperplasia w/o atypia, polyp  2003  2004  . ENDOMETRIAL BIOPSY  3/07   benign exop hormone effect  . ENDOMETRIAL BIOPSY  3/11   benign  . EYE SURGERY Left 2013   cornea surgery  . EYE SURGERY Bilateral    cataracts  . TONSILLECTOMY AND ADENOIDECTOMY    . WISDOM TOOTH EXTRACTION      Current Outpatient Medications   Medication Sig Dispense Refill  . Acetylcarnitine HCl (ACETYL L-CARNITINE) 250 MG CAPS Take by mouth.    . Biotin w/ Vitamins C & E (HAIR/SKIN/NAILS PO) Take 1 capsule by mouth daily.    . Calcium-Magnesium-Vitamin D (CALCIUM MAGNESIUM PO) Take 1 tablet by mouth daily.    . clonazePAM (KLONOPIN) 0.5 MG tablet Take 0.5 mg by mouth 2 (two) times daily as needed for anxiety.    . Coenzyme Q10 ([employer] COENZYME Q10) 100 MG capsule Take 200 mg by mouth daily.     . diphenhydrAMINE (BENADRYL) 25 MG tablet Take 25 mg by mouth 2 (two) times daily.    Marland Kitchen ELIDEL 1 % cream Apply 1 application topically as needed.  2  . Eyelid Cleansers (AVENOVA) 0.01 % SOLN APPLY A SMALL AMOUNT TO SKIN TWICE A DAY AS DIRECTED  99  .  fluocinonide (LIDEX) 0.05 % external solution   2  . fluorometholone (FML) 0.1 % ophthalmic suspension Place 2 drops into both eyes daily.    Marland Kitchen ibuprofen (ADVIL,MOTRIN) 200 MG tablet Take 200 mg by mouth every 6 (six) hours as needed.    . Multiple Vitamin (MULTIVITAMIN) capsule Take 1 capsule by mouth daily.    . Multiple Vitamins-Minerals (EYE SUPPORT PO) Take by mouth daily.    . NON FORMULARY Marine Collagen Peptide - take one tablet by mouth daily    . NON FORMULARY Circulation vein support - one tablet by mouth daily    . omega-3 acid ethyl esters (LOVAZA) 1 g capsule Take by mouth daily.    Marland Kitchen OVER THE COUNTER MEDICATION Breath tablets--takes daily to help with bad breath    . sodium chloride (MURO 128) 2 % ophthalmic solution Place 1 drop into both eyes.    Marland Kitchen spironolactone (ALDACTONE) 50 MG tablet Take two tablets (100 mg) by mouth in the morning. 180 tablet 1   No current facility-administered medications for this visit.      ALLERGIES: Christina Warren has no known allergies.  Family History  Problem Relation Age of Onset  . Rheum arthritis Mother   . Heart disease Father   . Heart disease Brother   . Colon cancer Neg Hx   . Colon polyps Neg Hx   . Rectal cancer Neg Hx   . Stomach  cancer Neg Hx     Social History   Socioeconomic History  . Marital status: Single    Spouse name: Not on file  . Number of children: Not on file  . Years of education: Not on file  . Highest education level: Not on file  Occupational History  . Not on file  Social Needs  . Financial resource strain: Not on file  . Food insecurity:    Worry: Not on file    Inability: Not on file  . Transportation needs:    Medical: Not on file    Non-medical: Not on file  Tobacco Use  . Smoking status: Never Smoker  . Smokeless tobacco: Never Used  Substance and Sexual Activity  . Alcohol use: Yes    Alcohol/week: 0.0 oz    Comment: 1 drink every 3 months  . Drug use: No  . Sexual activity: Not Currently    Partners: Male    Birth control/protection: Post-menopausal  Lifestyle  . Physical activity:    Days per week: Not on file    Minutes per session: Not on file  . Stress: Not on file  Relationships  . Social connections:    Talks on phone: Not on file    Gets together: Not on file    Attends religious service: Not on file    Active member of club or organization: Not on file    Attends meetings of clubs or organizations: Not on file    Relationship status: Not on file  . Intimate partner violence:    Fear of current or ex partner: Not on file    Emotionally abused: Not on file    Physically abused: Not on file    Forced sexual activity: Not on file  Other Topics Concern  . Not on file  Social History Narrative  . Not on file    Review of Systems  Constitutional: Negative.   HENT: Negative.   Eyes: Negative.   Respiratory: Negative.   Cardiovascular: Negative.   Gastrointestinal: Negative.   Endocrine: Negative.  Genitourinary: Negative.   Musculoskeletal: Negative.   Skin: Negative.   Allergic/Immunologic: Negative.   Neurological: Negative.   Hematological: Negative.   Psychiatric/Behavioral: Negative.     PHYSICAL EXAMINATION:    BP 122/74 (BP Location:  Right Arm, Christina Warren Position: Sitting, Cuff Size: Normal)   Pulse 86   Resp 16   Ht 5' 1.5" (1.562 m)   Wt 145 lb (65.8 kg)   LMP 03/21/2009   BMI 26.95 kg/m     General appearance: alert, cooperative and appears stated age   ASSESSMENT  Facial hair.   PLAN  Check BMET. We talked about the multiple uses for spironolactone - HTN, diuresis, increased hair growth.  OK to continue spironolactone 50 mg, take 2 in the am.  Dips:  180, RF 2.  She declines Vaniqa due to her rosacea.  FU prn.   An After Visit Summary was printed and given to the Christina Warren.  ___15___ minutes face to face time of which over 50% was spent in counseling.

## 2017-07-18 LAB — BASIC METABOLIC PANEL
BUN/Creatinine Ratio: 16 (ref 12–28)
BUN: 13 mg/dL (ref 8–27)
CO2: 29 mmol/L (ref 20–29)
Calcium: 9.8 mg/dL (ref 8.7–10.3)
Chloride: 100 mmol/L (ref 96–106)
Creatinine, Ser: 0.8 mg/dL (ref 0.57–1.00)
GFR calc Af Amer: 91 mL/min/{1.73_m2} (ref >59)
GFR calc non Af Amer: 79 mL/min/{1.73_m2} (ref >59)
Glucose: 108 mg/dL — ABNORMAL HIGH (ref 65–99)
Potassium: 4.5 mmol/L (ref 3.5–5.2)
Sodium: 143 mmol/L (ref 134–144)

## 2018-07-14 ENCOUNTER — Other Ambulatory Visit: Payer: Self-pay | Admitting: Obstetrics and Gynecology

## 2018-07-14 NOTE — Telephone Encounter (Signed)
Medication refill request: Aldactone 50 mg  Last AEX:  06/05/2017 Next AEX: 07/29/2018 Last MMG (if hormonal medication request): 04/02/2017 Birads 1 negative Refill authorized: Aldactone 50 mg #[id number]  Please refill if appropriate

## 2018-07-16 ENCOUNTER — Ambulatory Visit: Payer: BLUE CROSS/BLUE SHIELD | Admitting: Obstetrics and Gynecology

## 2018-07-27 ENCOUNTER — Other Ambulatory Visit: Payer: Self-pay

## 2018-07-29 ENCOUNTER — Encounter: Payer: Self-pay | Admitting: Obstetrics and Gynecology

## 2018-07-29 ENCOUNTER — Other Ambulatory Visit (HOSPITAL_COMMUNITY)
Admission: RE | Admit: 2018-07-29 | Discharge: 2018-07-29 | Disposition: A | Discharge status: Home / Self Care | Payer: BC Managed Care – PPO | Source: Ambulatory Visit | Attending: Obstetrics and Gynecology | Admitting: Obstetrics and Gynecology

## 2018-07-29 ENCOUNTER — Other Ambulatory Visit: Payer: Self-pay

## 2018-07-29 ENCOUNTER — Ambulatory Visit (INDEPENDENT_AMBULATORY_CARE_PROVIDER_SITE_OTHER): Payer: BC Managed Care – PPO | Admitting: Obstetrics and Gynecology

## 2018-07-29 VITALS — BP 128/70 | HR 88 | Temp 97.3°F | Resp 14 | Ht 61.75 in | Wt 145.8 lb

## 2018-07-29 DIAGNOSIS — Z5181 Encounter for therapeutic drug level monitoring: Secondary | ICD-10-CM | POA: Diagnosis not present

## 2018-07-29 DIAGNOSIS — Z01419 Encounter for gynecological examination (general) (routine) without abnormal findings: Secondary | ICD-10-CM | POA: Diagnosis not present

## 2018-07-29 DIAGNOSIS — N812 Incomplete uterovaginal prolapse: Secondary | ICD-10-CM

## 2018-07-29 MED ORDER — SPIRONOLACTONE 50 MG PO TABS: ORAL_TABLET | ORAL | 11 refills | Status: DC

## 2018-07-29 NOTE — Patient Instructions (Signed)

## 2018-07-29 NOTE — Progress Notes (Signed)
63 y.o. G0P0 Single Caucasian female here for annual exam.    Pushes her uterus back up in the vagina.  She empties her bladder more often.  She takes cranberry juice.  No leak of urine with a cough or laugh.  No fecal incontinence.  No constipation.   Denies any vaginal bleeding or spotting.   Taking spironolactone in the am. She wants to continue this for hair growth.  She uses this periodicaly.  Has gained weight back.  She will work on this.   Stress at work.  She has exposures to different people all the time.  Has already had 2 exposures to Covid 19.  Covid testing negative in April and June, 2020.   PCP:  Dr. Levin Erp  Patient's last menstrual period was 03/21/2009.           Sexually active: No.  The current method of family planning is post menopausal status.    Exercising: No.  The patient does not participate in regular exercise at present. Smoker:  no  Health Maintenance: Pap:  04-12-15 Neg:Neg HR HPV History of abnormal Pap:  no MMG:  04/02/17 BIRADS 1 negative/density b -- scheduled 08/15/18 Colonoscopy:  07/11/16 normal f/u 10 years -- Dr. Henrene Pastor BMD:   12/14/11  Result  Normal TDaP:  UTD with PCP HIV and Hep C: 05/27/16 Negative Screening Labs: PCP   reports that she has never smoked. She has never used smokeless tobacco. She reports current alcohol use. She reports that she does not use drugs.  Past Medical History:  Diagnosis Date  . Allergy   . Anxiety   . Cataract    Had surgery to remove  . Diabetes mellitus without complication ([place])    History - no problem since 2013 - lost weight approx 80 lbs  . Diverticulosis   . Eczema   . Endometrial hyperplasia    Hx of this in past.  Was treated with weight loss and Mirena IUD.  EMS 1.3 mm on 06/20/16.  . Fibroid    By ultrasound 06/20/16.  Marland Kitchen Hyperlipidemia   . Hypertension    History - no problems since 2013 - lost weight - 80 lbs  . Interstitial cystitis    History - no problems since 2000  .  Polycystic ovary   . Rosacea   . Urinary incontinence     Past Surgical History:  Procedure Laterality Date  . COLONOSCOPY    . DILATION AND CURETTAGE OF UTERUS    . Endo hyperplasia w/o atypia, polyp  2003  2004  . ENDOMETRIAL BIOPSY  3/07   benign exop hormone effect  . ENDOMETRIAL BIOPSY  3/11   benign  . EYE SURGERY Left 2013   cornea surgery  . EYE SURGERY Bilateral    cataracts  . TONSILLECTOMY AND ADENOIDECTOMY    . WISDOM TOOTH EXTRACTION      Current Outpatient Medications  Medication Sig Dispense Refill  . Acetylcarnitine HCl (ACETYL L-CARNITINE) 250 MG CAPS Take by mouth.    . Biotin w/ Vitamins C & E (HAIR/SKIN/NAILS PO) Take 1 capsule by mouth daily.    . Calcium-Magnesium-Vitamin D (CALCIUM MAGNESIUM PO) Take 1 tablet by mouth daily.    . clonazePAM (KLONOPIN) 0.5 MG tablet Take 0.5 mg by mouth 2 (two) times daily as needed for anxiety.    . Coenzyme Q10 ([employer]S COENZYME Q10) 100 MG capsule Take 200 mg by mouth daily.     Marland Kitchen CRANBERRY PO Take by mouth.    Marland Kitchen  diphenhydrAMINE (BENADRYL) 25 MG tablet Take 25 mg by mouth 2 (two) times daily.    Marland Kitchen doxycycline (VIBRA-TABS) 100 MG tablet     . Eyelid Cleansers (AVENOVA) 0.01 % SOLN APPLY A SMALL AMOUNT TO SKIN TWICE A DAY AS DIRECTED  99  . fluocinonide (LIDEX) 0.05 % external solution   2  . fluorometholone (FML) 0.1 % ophthalmic suspension Place 2 drops into both eyes daily.    . Glucosamine-Chondroitin (GLUCOSAMINE CHONDR COMPLEX PO) Take by mouth.    Marland Kitchen ibuprofen (ADVIL,MOTRIN) 200 MG tablet Take 200 mg by mouth every 6 (six) hours as needed.    . Melatonin 1 MG CAPS Take by mouth.    . Multiple Vitamin (MULTIVITAMIN) capsule Take 1 capsule by mouth daily.    . Multiple Vitamins-Minerals (EYE SUPPORT PO) Take by mouth daily.    . NON FORMULARY Marine Collagen Peptide - take one tablet by mouth daily    . NON FORMULARY Circulation vein support - one tablet by mouth daily    . omega-3 acid ethyl esters (LOVAZA) 1 g  capsule Take by mouth daily.    Marland Kitchen OVER THE COUNTER MEDICATION Breath tablets--takes daily to help with bad breath    . Polyethyl Glycol-Propyl Glycol (SYSTANE FREE OP) Apply to eye.    Marland Kitchen Resveratrol 100 MG CAPS Take by mouth.    . sodium chloride (MURO 128) 2 % ophthalmic solution Place 1 drop into both eyes.    Marland Kitchen spironolactone (ALDACTONE) 50 MG tablet TAKE TWO TABLETS BY MOUTH  IN THE MORNING. 60 tablet 0  . THEANINE PO Take by mouth.    . vitamin B-12 (CYANOCOBALAMIN) 100 MCG tablet Take 100 mcg by mouth daily.     No current facility-administered medications for this visit.     Family History  Problem Relation Age of Onset  . Rheum arthritis Mother   . Heart disease Father   . Heart disease Brother   . Colon cancer Neg Hx   . Colon polyps Neg Hx   . Rectal cancer Neg Hx   . Stomach cancer Neg Hx     Review of Systems  Constitutional: Negative.   HENT: Negative.   Eyes: Negative.   Respiratory: Negative.   Cardiovascular: Negative.   Gastrointestinal: Negative.   Endocrine: Negative.   Genitourinary: Negative.   Musculoskeletal: Negative.   Skin: Negative.   Allergic/Immunologic: Negative.   Neurological: Negative.   Hematological: Negative.   Psychiatric/Behavioral: Negative.     Exam:   BP 128/70 (BP Location: Left Arm, Patient Position: Sitting, Cuff Size: Normal)   Pulse 88   Temp (!) 97.3 F (36.3 C) (Temporal)   Resp 14   Ht 5' 1.75" (1.568 m)   Wt 145 lb 12.8 oz (66.1 kg)   LMP 03/21/2009   BMI 26.88 kg/m     General appearance: alert, cooperative and appears stated age Head: normocephalic, without obvious abnormality, atraumatic Neck: no adenopathy, supple, symmetrical, trachea midline and thyroid normal to inspection and palpation Lungs: clear to auscultation bilaterally Breasts: normal appearance, no masses or tenderness, No nipple retraction or dimpling, No nipple discharge or bleeding, No axillary adenopathy Heart: regular rate and  rhythm Abdomen: soft, non-tender; no masses, no organomegaly Extremities: extremities normal, atraumatic, no cyanosis or edema Skin: skin color, texture, turgor normal. No rashes or lesions Lymph nodes: cervical, supraclavicular, and axillary nodes normal. Neurologic: grossly normal  Pelvic: External genitalia:  no lesions  No abnormal inguinal nodes palpated.              Urethra:  normal appearing urethra with no masses, tenderness or lesions              Bartholins and Skenes: normal                 Vagina: normal appearing vagina with normal color and discharge, no lesions              Cervix: no lesions              Pap taken: Yes.   Bimanual Exam:  Uterus:  normal size, contour, position, consistency, mobility, non-tender.  Second degree uterine prolapse.  First degree rectocele.               Adnexa: no mass, fullness, tenderness              Rectal exam: Yes.  .  Confirms.              Anus:  normal sphincter tone, no lesions  Chaperone was present for exam.  Assessment:   Well woman visit with normal exam. Mirena IUD patient until 2016.  History of endometrial hyperplasia.  Fibroid uterus.  Abnormal facial hair. Significant intentional weight loss. Incomplete uterovaginal prolapse.  Plan: Mammogram screening discussed. Self breast awareness reviewed. Pap and HR HPV as above. Guidelines for Calcium, Vitamin D, regular exercise program including cardiovascular and weight bearing exercise. Will check BMP for monitoring of spironolactone.  We discussed a potential pessary for her.  She will consider this.   ACOG information on prolapse.  Follow up annually and prn.   After visit summary provided.

## 2018-07-30 LAB — BASIC METABOLIC PANEL
BUN/Creatinine Ratio: 14 (ref 12–28)
BUN: 10 mg/dL (ref 8–27)
CO2: 25 mmol/L (ref 20–29)
Calcium: 9.7 mg/dL (ref 8.7–10.3)
Chloride: 102 mmol/L (ref 96–106)
Creatinine, Ser: 0.7 mg/dL (ref 0.57–1.00)
GFR calc Af Amer: 107 mL/min/{1.73_m2} (ref >59)
GFR calc non Af Amer: 93 mL/min/{1.73_m2} (ref >59)
Glucose: 100 mg/dL — ABNORMAL HIGH (ref 65–99)
Potassium: 4.3 mmol/L (ref 3.5–5.2)
Sodium: 143 mmol/L (ref 134–144)

## 2018-07-31 LAB — CYTOLOGY - PAP
Diagnosis: NEGATIVE
HPV: NOT DETECTED

## 2018-08-15 ENCOUNTER — Encounter: Payer: Self-pay | Admitting: Obstetrics and Gynecology

## 2019-08-26 ENCOUNTER — Encounter: Payer: Self-pay | Admitting: Obstetrics and Gynecology

## 2019-09-13 NOTE — Progress Notes (Signed)
64 y.o. G0P0 Single Caucasian female here for annual exam.  Had COVID vaccines.  Patient is leaving the area. She already has a new PCP.   PCP:  Levin Erp, MD   Patient's last menstrual period was 03/21/2009.           Sexually active: No.  The current method of family planning is post menopausal status.    Exercising: No.  The patient does not participate in regular exercise at present. Smoker:  no  Health Maintenance: Pap: 07-29-18 Neg:Neg HR HPV, 04-12-15 Neg:Neg HR HPV History of abnormal Pap:  no MMG: 08-26-19 3D/Neg/density A/BiRads1 Colonoscopy: 07/11/16 normal f/u 10 years  BMD: 12-14-11  Result :Normal TDaP:  PCP Gardasil:   no HIV:05-27-16 NR Hep C:05-27-16 Neg Screening Labs:  PCP.    reports that she has never smoked. She has never used smokeless tobacco. She reports previous alcohol use. She reports that she does not use drugs.  Past Medical History:  Diagnosis Date  . Allergy   . Anxiety   . Cataract    Had surgery to remove  . Diabetes mellitus without complication ([place])    History - no problem since 2013 - lost weight approx 80 lbs  . Diverticulosis   . Eczema   . Endometrial hyperplasia    Hx of this in past.  Was treated with weight loss and Mirena IUD.  EMS 1.3 mm on 06/20/16.  . Fibroid    By ultrasound 06/20/16.  Marland Kitchen Hyperlipidemia   . Hypertension    History - no problems since 2013 - lost weight - 80 lbs  . Interstitial cystitis    History - no problems since 2000  . Polycystic ovary   . Rosacea   . Urinary incontinence     Past Surgical History:  Procedure Laterality Date  . COLONOSCOPY    . DILATION AND CURETTAGE OF UTERUS    . Endo hyperplasia w/o atypia, polyp  2003  2004  . ENDOMETRIAL BIOPSY  3/07   benign exop hormone effect  . ENDOMETRIAL BIOPSY  3/11   benign  . EYE SURGERY Left 2013   cornea surgery  . EYE SURGERY Bilateral    cataracts  . TONSILLECTOMY AND ADENOIDECTOMY    . WISDOM TOOTH EXTRACTION      Current Outpatient  Medications  Medication Sig Dispense Refill  . Acetylcarnitine HCl (ACETYL L-CARNITINE) 250 MG CAPS Take by mouth.    . Calcium-Magnesium-Vitamin D (CALCIUM MAGNESIUM PO) Take 1 tablet by mouth daily.    . clonazePAM (KLONOPIN) 0.5 MG tablet Take 0.5 mg by mouth 2 (two) times daily as needed for anxiety.    . Coenzyme Q10 ([employer] COENZYME Q10) 100 MG capsule Take 200 mg by mouth daily.     Marland Kitchen CRANBERRY PO Take by mouth.    . diphenhydrAMINE (BENADRYL) 25 MG tablet Take 25 mg by mouth 2 (two) times daily.    Marland Kitchen doxycycline (VIBRA-TABS) 100 MG tablet     . escitalopram (LEXAPRO) 5 MG tablet Take 5 mg by mouth daily.    . Eyelid Cleansers (AVENOVA) 0.01 % SOLN APPLY A SMALL AMOUNT TO SKIN TWICE A DAY AS DIRECTED  99  . fluocinonide (LIDEX) 0.05 % external solution   2  . fluorometholone (FML) 0.1 % ophthalmic suspension Place 2 drops into both eyes daily.    . Glucosamine-Chondroitin (GLUCOSAMINE CHONDR COMPLEX PO) Take by mouth.    . hydrOXYzine (ATARAX/VISTARIL) 10 MG tablet Take by mouth.    Marland Kitchen  ibuprofen (ADVIL,MOTRIN) 200 MG tablet Take 200 mg by mouth every 6 (six) hours as needed.    . Melatonin 1 MG CAPS Take by mouth.    . Multiple Vitamin (MULTIVITAMIN) capsule Take 1 capsule by mouth daily.    . Multiple Vitamins-Minerals (EYE SUPPORT PO) Take by mouth daily.    . NON FORMULARY Circulation vein support - one tablet by mouth daily    . omega-3 acid ethyl esters (LOVAZA) 1 g capsule Take by mouth daily.    Vladimir Faster Glycol-Propyl Glycol (SYSTANE FREE OP) Apply to eye.    Marland Kitchen Resveratrol 100 MG CAPS Take by mouth.    . sodium chloride (MURO 128) 2 % ophthalmic solution Place 1 drop into both eyes.    Marland Kitchen spironolactone (ALDACTONE) 50 MG tablet TAKE TWO TABLETS BY MOUTH  IN THE MORNING. 60 tablet 11   No current facility-administered medications for this visit.    Family History  Problem Relation Age of Onset  . Rheum arthritis Mother   . Heart disease Father   . Heart disease Brother    . Colon cancer Neg Hx   . Colon polyps Neg Hx   . Rectal cancer Neg Hx   . Stomach cancer Neg Hx     Review of Systems  All other systems reviewed and are negative.   Exam:   BP 122/68   Pulse 84   Resp 16   Ht 5' 1.5" (1.562 m)   Wt 148 lb (67.1 kg)   LMP 03/21/2009   BMI 27.51 kg/m     General appearance: alert, cooperative and appears stated age Head: normocephalic, without obvious abnormality, atraumatic Neck: no adenopathy, supple, symmetrical, trachea midline and thyroid normal to inspection and palpation Lungs: clear to auscultation bilaterally Breasts: normal appearance, no masses or tenderness, No nipple retraction or dimpling, No nipple discharge or bleeding, No axillary adenopathy Heart: regular rate and rhythm Abdomen: soft, non-tender; no masses, no organomegaly Extremities: extremities normal, atraumatic, no cyanosis or edema Skin: skin color, texture, turgor normal. No rashes or lesions Lymph nodes: cervical, supraclavicular, and axillary nodes normal. Neurologic: grossly normal  Pelvic: External genitalia:  no lesions              No abnormal inguinal nodes palpated.              Urethra:  normal appearing urethra with no masses, tenderness or lesions              Bartholins and Skenes: normal                 Vagina: normal appearing vagina with normal color and discharge, no lesions              Cervix: no lesions.  Third degree uterine prolapse, cervix passes just through the hymen.              Pap taken: No. Bimanual Exam:  Uterus:  normal size, contour, position, consistency, mobility, non-tender              Adnexa: no mass, fullness, tenderness              Rectal exam: Yes.  .  Confirms.              Anus:  normal sphincter tone, no lesions  Chaperone was present for exam.  Assessment:   Well woman visit with normal exam. Mirena IUD patient until 2016.  History of endometrial hyperplasia.  Fibroid uterus.  Abnormal facial hair. On  Spironolactone.   Incomplete uterovaginal prolapse  Plan: Mammogram screening discussed. Self breast awareness reviewed. Pap and HR HPV not needed today. Guidelines for Calcium, Vitamin D, regular exercise program including cardiovascular and weight bearing exercise. Refill of Spironolactone 100 mg q day.  Rx for 3 months.  Patient educated about the need to monitor her K level. Her new PCP will be taking over this prescription. She will establish care with a gynecologist in her new city.  I briefly discussed pessary use versus hysterectomy with a vaginal reconstruction.  Follow up annually and prn.    After visit summary provided.

## 2019-09-15 ENCOUNTER — Encounter: Payer: Self-pay | Admitting: Obstetrics and Gynecology

## 2019-09-15 ENCOUNTER — Ambulatory Visit (INDEPENDENT_AMBULATORY_CARE_PROVIDER_SITE_OTHER): Payer: BC Managed Care – PPO | Admitting: Obstetrics and Gynecology

## 2019-09-15 ENCOUNTER — Other Ambulatory Visit: Payer: Self-pay

## 2019-09-15 VITALS — BP 122/68 | HR 84 | Resp 16 | Ht 61.5 in | Wt 148.0 lb

## 2019-09-15 DIAGNOSIS — Z01419 Encounter for gynecological examination (general) (routine) without abnormal findings: Secondary | ICD-10-CM

## 2019-09-15 MED ORDER — SPIRONOLACTONE 50 MG PO TABS: ORAL_TABLET | ORAL | 0 refills | Status: AC

## 2019-09-15 NOTE — Patient Instructions (Signed)

## 2019-11-27 ENCOUNTER — Other Ambulatory Visit: Payer: Self-pay | Admitting: Obstetrics and Gynecology

## 2019-11-29 NOTE — Telephone Encounter (Signed)
Medication refill request: spironolactone 50mg  Last AEX:  09-15-2019 Next AEX: not scheduled. Last MMG (if hormonal medication request): 08-26-2019 category a density birads 1:neg Refill authorized: per note, it looks like patient is to have another provider continue refilling for her. Please approve or deny as appropriate.
